# Patient Record
Sex: Female | Born: 1950 | Race: Black or African American | Hispanic: No | Smoking: Never smoker
Health system: Southern US, Community
[De-identification: ages and names within clinical notes are randomized; demographics above are authoritative.]

## PROBLEM LIST (undated history)

## (undated) DIAGNOSIS — I10 Essential (primary) hypertension: Secondary | ICD-10-CM

## (undated) DIAGNOSIS — K219 Gastro-esophageal reflux disease without esophagitis: Secondary | ICD-10-CM

## (undated) DIAGNOSIS — E782 Mixed hyperlipidemia: Secondary | ICD-10-CM

## (undated) HISTORY — DX: Gastro-esophageal reflux disease without esophagitis: K21.9

## (undated) HISTORY — PX: CHOLECYSTECTOMY: SHX55

## (undated) HISTORY — PX: APPENDECTOMY: SHX54

## (undated) HISTORY — PX: ABDOMINAL SURGERY: SHX537

## (undated) HISTORY — PX: SHOULDER SURGERY: SHX246

## (undated) HISTORY — PX: TUBAL LIGATION: SHX77

## (undated) HISTORY — DX: Essential (primary) hypertension: I10

---

## 2010-12-28 HISTORY — PX: COLONOSCOPY: SHX174

## 2014-01-23 ENCOUNTER — Other Ambulatory Visit: Payer: Self-pay | Admitting: *Deleted

## 2014-01-23 DIAGNOSIS — I83221 Varicose veins of left lower extremity with both ulcer of thigh and inflammation: Secondary | ICD-10-CM

## 2014-01-23 DIAGNOSIS — I83223 Varicose veins of left lower extremity with both ulcer of ankle and inflammation: Principal | ICD-10-CM

## 2014-01-23 DIAGNOSIS — I83224 Varicose veins of left lower extremity with both ulcer of heel and midfoot and inflammation: Principal | ICD-10-CM

## 2014-01-23 DIAGNOSIS — I83229 Varicose veins of left lower extremity with both ulcer of unspecified site and inflammation: Principal | ICD-10-CM

## 2014-01-23 DIAGNOSIS — I83225 Varicose veins of left lower extremity with both ulcer other part of foot and inflammation: Principal | ICD-10-CM

## 2014-01-23 DIAGNOSIS — I83222 Varicose veins of left lower extremity with both ulcer of calf and inflammation: Principal | ICD-10-CM

## 2014-01-23 DIAGNOSIS — I83228 Varicose veins of left lower extremity with both ulcer of other part of lower extremity and inflammation: Principal | ICD-10-CM

## 2014-02-10 ENCOUNTER — Encounter: Payer: 59 | Admitting: Vascular Surgery

## 2014-02-10 ENCOUNTER — Encounter (HOSPITAL_COMMUNITY): Payer: 59

## 2014-03-16 ENCOUNTER — Encounter: Payer: Self-pay | Admitting: Vascular Surgery

## 2014-03-17 ENCOUNTER — Ambulatory Visit (HOSPITAL_COMMUNITY)
Admission: RE | Admit: 2014-03-17 | Discharge: 2014-03-17 | Disposition: A | Discharge status: Home / Self Care | Payer: 59 | Source: Ambulatory Visit | Attending: Vascular Surgery | Admitting: Vascular Surgery

## 2014-03-17 ENCOUNTER — Ambulatory Visit (INDEPENDENT_AMBULATORY_CARE_PROVIDER_SITE_OTHER): Payer: 59 | Admitting: Vascular Surgery

## 2014-03-17 ENCOUNTER — Encounter: Payer: Self-pay | Admitting: Vascular Surgery

## 2014-03-17 VITALS — BP 152/92 | HR 69 | Resp 16 | Ht 67.0 in | Wt 210.0 lb

## 2014-03-17 DIAGNOSIS — I83222 Varicose veins of left lower extremity with both ulcer of calf and inflammation: Secondary | ICD-10-CM

## 2014-03-17 DIAGNOSIS — R600 Localized edema: Secondary | ICD-10-CM | POA: Insufficient documentation

## 2014-03-17 DIAGNOSIS — I83225 Varicose veins of left lower extremity with both ulcer other part of foot and inflammation: Secondary | ICD-10-CM

## 2014-03-17 DIAGNOSIS — I83899 Varicose veins of unspecified lower extremities with other complications: Secondary | ICD-10-CM | POA: Insufficient documentation

## 2014-03-17 DIAGNOSIS — I83228 Varicose veins of left lower extremity with both ulcer of other part of lower extremity and inflammation: Secondary | ICD-10-CM

## 2014-03-17 DIAGNOSIS — I83892 Varicose veins of left lower extremities with other complications: Secondary | ICD-10-CM

## 2014-03-17 DIAGNOSIS — I83223 Varicose veins of left lower extremity with both ulcer of ankle and inflammation: Secondary | ICD-10-CM

## 2014-03-17 DIAGNOSIS — I83221 Varicose veins of left lower extremity with both ulcer of thigh and inflammation: Secondary | ICD-10-CM

## 2014-03-17 DIAGNOSIS — I83224 Varicose veins of left lower extremity with both ulcer of heel and midfoot and inflammation: Secondary | ICD-10-CM

## 2014-03-17 DIAGNOSIS — I83229 Varicose veins of left lower extremity with both ulcer of unspecified site and inflammation: Secondary | ICD-10-CM

## 2014-03-17 NOTE — Progress Notes (Signed)
Subjective:     Patient ID: Yesenia Bishop, female   DOB: 11-05-1950, 63 y.o.   MRN: [id number]  HPI this 63 year old female was referred by Dr. Nils Pyle in the [facility] [address] wound center for evaluation of venous insufficiency. Patient has had multiple ulcerations left leg over the last few years which ever acquired treatment to heal. She has chronic swelling the left leg. She has no history of bleeding or ulceration in the right leg. She does not wear long-leg elastic compression stockings but does occasionally wear short-leg stockings. She denies a history of DVT in the left leg but did have a DVT in the right leg after her surgical procedure 10 years ago requiring Coumadin on a short-term basis. Sheet develops aching and throbbing discomfort in the left leg as the day progresses.  History reviewed. No pertinent past medical history.  History  Substance Use Topics  . Smoking status: Never Smoker   . Smokeless tobacco: Not on file  . Alcohol Use: 0.6 oz/week    1 Glasses of wine per week    Family History  Problem Relation Age of Onset  . Diabetes Father   . Heart disease Father   . Hyperlipidemia Father   . Hypertension Father   . Varicose Veins Father   . Hypertension Brother     No Known Allergies  Current outpatient prescriptions:amLODipine-olmesartan (AZOR) 10-40 MG per tablet, Take 1 tablet by mouth daily., Disp: , Rfl: ;  aspirin 81 MG tablet, Take 81 mg by mouth daily., Disp: , Rfl: ;  metoprolol succinate (TOPROL-XL) 100 MG 24 hr tablet, Take 100 mg by mouth 2 (two) times daily. Take with or immediately following a meal. Take half a pill BID, Disp: , Rfl: ;  pantoprazole (PROTONIX) 40 MG tablet, Take 40 mg by mouth daily., Disp: , Rfl:   BP 152/92  Pulse 69  Resp 16  Ht 5\' 7"  (1.702 m)  Wt 210 lb (95.255 kg)  BMI 32.88 kg/m2  Body mass index is 32.88 kg/(m^2).          Review of Systems denies chest pain, dyspnea on exertion, PND, orthopnea, hemoptysis, lateralizing  weakness, aphasia, amaurosis fugax, diplopia, syncope. All other systems negative and a complete review of systems     Objective:   Physical Exam BP 152/92  Pulse 69  Resp 16  Ht 5\' 7"  (1.702 m)  Wt 210 lb (95.255 kg)  BMI 32.88 kg/m2  Gen.-alert and oriented x3 in no apparent distress HEENT normal for age Lungs no rhonchi or wheezing Cardiovascular regular rhythm no murmurs carotid pulses 3+ palpable no bruits audible Abdomen soft nontender no palpable masses Musculoskeletal free of  major deformities Skin -left leg with 2 areas of severe hyperpigmentation in the lower half of the pretibial region and just proximal to the medial malleolus. No active ulcer but evidence of 2 healed ulcers. 1+ edema left leg.  Neurologic normal Lower extremities 3+ femoral and dorsalis pedis pulses palpable bilateral with 1+ edema left leg no edema right leg.  Today I ordered a venous duplex exam of the left leg which are reviewed and interpreted. There is no DVT. There is some mild reflux in the deep system. There is gross reflux throughout the entire left great saphenous vein from the knee to near the saphenofemoral junction.        Assessment:     History venous stasis ulcers-multiple left leg due to gross reflux left great saphenous vein    Plan:         #  1 long leg elastic compression stockings 20-30 mm gradient #2 elevate legs as much as possible #3 ibuprofen daily on a regular basis for pain #4 return in 3 months-if no significant improvement then patient will need laser blush in the left great saphenous vein

## 2014-06-22 ENCOUNTER — Encounter: Payer: Self-pay | Admitting: Vascular Surgery

## 2014-06-23 ENCOUNTER — Ambulatory Visit: Payer: 59 | Admitting: Vascular Surgery

## 2016-08-08 DIAGNOSIS — M79674 Pain in right toe(s): Secondary | ICD-10-CM | POA: Diagnosis not present

## 2016-08-08 DIAGNOSIS — M79675 Pain in left toe(s): Secondary | ICD-10-CM | POA: Diagnosis not present

## 2016-08-08 DIAGNOSIS — L600 Ingrowing nail: Secondary | ICD-10-CM | POA: Diagnosis not present

## 2016-08-08 DIAGNOSIS — B351 Tinea unguium: Secondary | ICD-10-CM | POA: Diagnosis not present

## 2016-08-08 DIAGNOSIS — L602 Onychogryphosis: Secondary | ICD-10-CM | POA: Diagnosis not present

## 2016-10-24 DIAGNOSIS — M79674 Pain in right toe(s): Secondary | ICD-10-CM | POA: Diagnosis not present

## 2016-10-24 DIAGNOSIS — L600 Ingrowing nail: Secondary | ICD-10-CM | POA: Diagnosis not present

## 2016-10-24 DIAGNOSIS — M722 Plantar fascial fibromatosis: Secondary | ICD-10-CM | POA: Diagnosis not present

## 2016-10-24 DIAGNOSIS — M79675 Pain in left toe(s): Secondary | ICD-10-CM | POA: Diagnosis not present

## 2016-10-24 DIAGNOSIS — B351 Tinea unguium: Secondary | ICD-10-CM | POA: Diagnosis not present

## 2016-10-24 DIAGNOSIS — L602 Onychogryphosis: Secondary | ICD-10-CM | POA: Diagnosis not present

## 2016-12-14 DIAGNOSIS — H2513 Age-related nuclear cataract, bilateral: Secondary | ICD-10-CM | POA: Diagnosis not present

## 2016-12-14 DIAGNOSIS — H25013 Cortical age-related cataract, bilateral: Secondary | ICD-10-CM | POA: Diagnosis not present

## 2016-12-14 DIAGNOSIS — H40013 Open angle with borderline findings, low risk, bilateral: Secondary | ICD-10-CM | POA: Diagnosis not present

## 2016-12-14 DIAGNOSIS — H04123 Dry eye syndrome of bilateral lacrimal glands: Secondary | ICD-10-CM | POA: Diagnosis not present

## 2017-03-01 DIAGNOSIS — Z1231 Encounter for screening mammogram for malignant neoplasm of breast: Secondary | ICD-10-CM | POA: Diagnosis not present

## 2017-03-07 DIAGNOSIS — Z23 Encounter for immunization: Secondary | ICD-10-CM | POA: Diagnosis not present

## 2017-03-19 DIAGNOSIS — Z1339 Encounter for screening examination for other mental health and behavioral disorders: Secondary | ICD-10-CM | POA: Diagnosis not present

## 2017-03-19 DIAGNOSIS — Z6832 Body mass index (BMI) 32.0-32.9, adult: Secondary | ICD-10-CM | POA: Diagnosis not present

## 2017-03-19 DIAGNOSIS — R001 Bradycardia, unspecified: Secondary | ICD-10-CM | POA: Diagnosis not present

## 2017-03-19 DIAGNOSIS — Z1331 Encounter for screening for depression: Secondary | ICD-10-CM | POA: Diagnosis not present

## 2017-03-19 DIAGNOSIS — Z7189 Other specified counseling: Secondary | ICD-10-CM | POA: Diagnosis not present

## 2017-03-19 DIAGNOSIS — Z79899 Other long term (current) drug therapy: Secondary | ICD-10-CM | POA: Diagnosis not present

## 2017-03-19 DIAGNOSIS — Z1211 Encounter for screening for malignant neoplasm of colon: Secondary | ICD-10-CM | POA: Diagnosis not present

## 2017-03-19 DIAGNOSIS — R5383 Other fatigue: Secondary | ICD-10-CM | POA: Diagnosis not present

## 2017-03-19 DIAGNOSIS — Z0000 Encounter for general adult medical examination without abnormal findings: Secondary | ICD-10-CM | POA: Diagnosis not present

## 2017-03-19 DIAGNOSIS — D649 Anemia, unspecified: Secondary | ICD-10-CM | POA: Diagnosis not present

## 2017-03-19 DIAGNOSIS — E8940 Asymptomatic postprocedural ovarian failure: Secondary | ICD-10-CM | POA: Diagnosis not present

## 2017-03-19 DIAGNOSIS — Z299 Encounter for prophylactic measures, unspecified: Secondary | ICD-10-CM | POA: Diagnosis not present

## 2017-03-19 DIAGNOSIS — I10 Essential (primary) hypertension: Secondary | ICD-10-CM | POA: Diagnosis not present

## 2017-03-26 DIAGNOSIS — R9431 Abnormal electrocardiogram [ECG] [EKG]: Secondary | ICD-10-CM | POA: Diagnosis not present

## 2017-04-10 DIAGNOSIS — E2839 Other primary ovarian failure: Secondary | ICD-10-CM | POA: Diagnosis not present

## 2017-05-14 DIAGNOSIS — H40013 Open angle with borderline findings, low risk, bilateral: Secondary | ICD-10-CM | POA: Diagnosis not present

## 2018-05-29 DIAGNOSIS — I219 Acute myocardial infarction, unspecified: Secondary | ICD-10-CM

## 2018-05-29 HISTORY — DX: Acute myocardial infarction, unspecified: I21.9

## 2018-08-27 ENCOUNTER — Inpatient Hospital Stay (HOSPITAL_COMMUNITY)
Admission: AD | Admit: 2018-08-27 | Discharge: 2018-08-28 | DRG: 282 | Disposition: A | Discharge status: Home / Self Care | Payer: Medicare HMO | Source: Other Acute Inpatient Hospital | Attending: Cardiology | Admitting: Cardiology

## 2018-08-27 DIAGNOSIS — I872 Venous insufficiency (chronic) (peripheral): Secondary | ICD-10-CM | POA: Diagnosis present

## 2018-08-27 DIAGNOSIS — I214 Non-ST elevation (NSTEMI) myocardial infarction: Secondary | ICD-10-CM | POA: Diagnosis present

## 2018-08-27 DIAGNOSIS — E782 Mixed hyperlipidemia: Secondary | ICD-10-CM | POA: Diagnosis present

## 2018-08-27 DIAGNOSIS — Z833 Family history of diabetes mellitus: Secondary | ICD-10-CM | POA: Diagnosis not present

## 2018-08-27 DIAGNOSIS — I10 Essential (primary) hypertension: Secondary | ICD-10-CM | POA: Diagnosis present

## 2018-08-27 DIAGNOSIS — Z8349 Family history of other endocrine, nutritional and metabolic diseases: Secondary | ICD-10-CM | POA: Diagnosis not present

## 2018-08-27 DIAGNOSIS — Z9049 Acquired absence of other specified parts of digestive tract: Secondary | ICD-10-CM

## 2018-08-27 DIAGNOSIS — I8390 Asymptomatic varicose veins of unspecified lower extremity: Secondary | ICD-10-CM | POA: Diagnosis present

## 2018-08-27 DIAGNOSIS — Z8249 Family history of ischemic heart disease and other diseases of the circulatory system: Secondary | ICD-10-CM | POA: Diagnosis not present

## 2018-08-27 DIAGNOSIS — R600 Localized edema: Secondary | ICD-10-CM | POA: Diagnosis present

## 2018-08-27 DIAGNOSIS — Z79899 Other long term (current) drug therapy: Secondary | ICD-10-CM

## 2018-08-27 HISTORY — DX: Mixed hyperlipidemia: E78.2

## 2018-08-27 LAB — LIPID PANEL
CHOLESTEROL: 216 mg/dL — AB (ref 0–200)
HDL: 85 mg/dL (ref >40)
LDL Cholesterol: 116 mg/dL — ABNORMAL HIGH (ref 0–99)
Total CHOL/HDL Ratio: 2.5 RATIO
Triglycerides: 74 mg/dL (ref <150)
VLDL: 15 mg/dL (ref 0–40)

## 2018-08-27 LAB — HEPARIN LEVEL (UNFRACTIONATED): Heparin Unfractionated: 0.81 IU/mL — ABNORMAL HIGH (ref 0.30–0.70)

## 2018-08-27 LAB — TROPONIN I: Troponin I: 0.18 ng/mL (ref <0.03)

## 2018-08-27 MED ORDER — SODIUM CHLORIDE 0.9 % IV SOLN: 250.0000 mL | INTRAVENOUS | Status: DC | PRN

## 2018-08-27 MED ORDER — NITROGLYCERIN 0.4 MG SL SUBL: 0.4000 mg | SUBLINGUAL_TABLET | SUBLINGUAL | Status: DC | PRN

## 2018-08-27 MED ORDER — ASPIRIN 81 MG PO CHEW
81.0000 mg | CHEWABLE_TABLET | ORAL | Status: AC
  Administered 2018-08-28: 81 mg via ORAL
  Filled 2018-08-27: qty 1

## 2018-08-27 MED ORDER — SODIUM CHLORIDE 0.9 % WEIGHT BASED INFUSION
3.0000 mL/kg/h | INTRAVENOUS | Status: AC
  Administered 2018-08-28: 04:00:00 3 mL/kg/h via INTRAVENOUS

## 2018-08-27 MED ORDER — SODIUM CHLORIDE 0.9% FLUSH: 3.0000 mL | INTRAVENOUS | Status: DC | PRN

## 2018-08-27 MED ORDER — NITROGLYCERIN IN D5W 200-5 MCG/ML-% IV SOLN: 10.0000 ug/min | INTRAVENOUS | Status: DC

## 2018-08-27 MED ORDER — ASPIRIN EC 81 MG PO TBEC: 81.0000 mg | DELAYED_RELEASE_TABLET | Freq: Every day | ORAL | Status: DC

## 2018-08-27 MED ORDER — ATORVASTATIN CALCIUM 80 MG PO TABS: 80.0000 mg | ORAL_TABLET | Freq: Every day | ORAL | Status: DC

## 2018-08-27 MED ORDER — ACETAMINOPHEN 325 MG PO TABS: 650.0000 mg | ORAL_TABLET | ORAL | Status: DC | PRN

## 2018-08-27 MED ORDER — HEPARIN (PORCINE) 25000 UT/250ML-% IV SOLN
900.0000 [IU]/h | INTRAVENOUS | Status: DC
  Administered 2018-08-27: 900 [IU]/h via INTRAVENOUS

## 2018-08-27 MED ORDER — ONDANSETRON HCL 4 MG/2ML IJ SOLN: 4.0000 mg | Freq: Four times a day (QID) | INTRAMUSCULAR | Status: DC | PRN

## 2018-08-27 MED ORDER — CARVEDILOL 6.25 MG PO TABS: 6.2500 mg | ORAL_TABLET | Freq: Two times a day (BID) | ORAL | Status: DC

## 2018-08-27 MED ORDER — SODIUM CHLORIDE 0.9% FLUSH: 3.0000 mL | Freq: Two times a day (BID) | INTRAVENOUS | Status: DC

## 2018-08-27 MED ORDER — SODIUM CHLORIDE 0.9 % WEIGHT BASED INFUSION
1.0000 mL/kg/h | INTRAVENOUS | Status: DC
  Administered 2018-08-28: 2.787 mL/kg/h via INTRAVENOUS

## 2018-08-27 NOTE — Progress Notes (Signed)
CRITICAL VALUE ALERT  Critical Value:  Troponin 0.18  Date & Time Notied:  08/27/2018 @approx . 2030  Provider Notified: Dr. Einar Gip already aware of pt's elevated Troponins from OSH. Pt already on Heparin gtt, scheduled for heart cath in AM, and presently denies pain. VSS  Orders Received/Actions taken: N/A

## 2018-08-27 NOTE — Progress Notes (Signed)
ANTICOAGULATION CONSULT NOTE - Initial Consult  Pharmacy Consult for heparin Indication: chest pain/ACS  No Known Allergies  Patient Measurements: Height: 5\' 6"  (167.6 cm) Weight: 197 lb 12 oz (89.7 kg) IBW/kg (Calculated) : 59.3 Heparin Dosing Weight: 78.8 kg  Vital Signs: Temp: 97.6 F (36.4 C) (03/31 1941) Temp Source: Oral (03/31 1941) BP: 151/96 (03/31 1941) Pulse Rate: 61 (03/31 1941)  Labs: No results for input(s): HGB, HCT, PLT, APTT, LABPROT, INR, HEPARINUNFRC, HEPRLOWMOCWT, CREATININE, CKTOTAL, CKMB, TROPONINI in the last 72 hours.  CrCl cannot be calculated (No successful lab value found.).   Medical History: No past medical history on file.  Medications:  Scheduled:  . [START ON 08/28/2018] aspirin  81 mg Oral Pre-Cath  . [START ON 08/28/2018] aspirin EC  81 mg Oral Daily  . [START ON 08/28/2018] atorvastatin  80 mg Oral q1800  . [START ON 08/28/2018] carvedilol  6.25 mg Oral BID WC  . sodium chloride flush  3 mL Intravenous Q12H    Assessment: 61 yof transferred from [facility], started on heparin infusion for ACS. No records of AC PTA. Received 5000 unit bolus on 3/31@1655  followed by 1000 units/hr infusion.  No s/sx of bleeding. Initial heparin level tonight (~3.5 hours after bolus and infusion initiation) came back at 0.81, on 1000 units/hr. No infusion issues since arriving.    Goal of Therapy:  Heparin level 0.3-0.7 units/ml Monitor platelets by anticoagulation protocol: Yes   Plan:  Reduce heparin infusion slightly to 900 units/hr Monitor daily HL, CBC, and for s/sx of bleeding  Antonietta Jewel, PharmD, [place] Clinical Pharmacist  Pager: [phone] Phone: [phone] 08/27/2018,8:25 PM

## 2018-08-28 ENCOUNTER — Other Ambulatory Visit: Payer: Self-pay

## 2018-08-28 ENCOUNTER — Ambulatory Visit (HOSPITAL_COMMUNITY): Admit: 2018-08-28 | Payer: Medicare HMO | Admitting: Cardiology

## 2018-08-28 ENCOUNTER — Inpatient Hospital Stay (HOSPITAL_COMMUNITY): Payer: Medicare HMO

## 2018-08-28 ENCOUNTER — Encounter (HOSPITAL_COMMUNITY)
Admission: AD | Disposition: A | Discharge status: Home / Self Care | Payer: Self-pay | Source: Other Acute Inpatient Hospital | Attending: Cardiology

## 2018-08-28 ENCOUNTER — Encounter (HOSPITAL_COMMUNITY): Payer: Self-pay | Admitting: Cardiology

## 2018-08-28 DIAGNOSIS — I10 Essential (primary) hypertension: Secondary | ICD-10-CM

## 2018-08-28 DIAGNOSIS — E782 Mixed hyperlipidemia: Secondary | ICD-10-CM | POA: Insufficient documentation

## 2018-08-28 DIAGNOSIS — I214 Non-ST elevation (NSTEMI) myocardial infarction: Principal | ICD-10-CM

## 2018-08-28 HISTORY — DX: Mixed hyperlipidemia: E78.2

## 2018-08-28 HISTORY — PX: LEFT HEART CATH AND CORONARY ANGIOGRAPHY: CATH118249

## 2018-08-28 LAB — CBC
HCT: 33.8 % — ABNORMAL LOW (ref 36.0–46.0)
HCT: 31.4 % — ABNORMAL LOW (ref 36.0–46.0)
HEMOGLOBIN: 10.7 g/dL — AB (ref 12.0–15.0)
Hemoglobin: 10.3 g/dL — ABNORMAL LOW (ref 12.0–15.0)
MCH: 25.4 pg — ABNORMAL LOW (ref 26.0–34.0)
MCH: 26.3 pg (ref 26.0–34.0)
MCHC: 31.7 g/dL (ref 30.0–36.0)
MCHC: 32.8 g/dL (ref 30.0–36.0)
MCV: 80.1 fL (ref 80.0–100.0)
MCV: 80.3 fL (ref 80.0–100.0)
Platelets: 190 10*3/uL (ref 150–400)
Platelets: 192 10*3/uL (ref 150–400)
RBC: 4.22 MIL/uL (ref 3.87–5.11)
RBC: 3.91 MIL/uL (ref 3.87–5.11)
RDW: 17 % — ABNORMAL HIGH (ref 11.5–15.5)
RDW: 17.2 % — ABNORMAL HIGH (ref 11.5–15.5)
WBC: 6.9 10*3/uL (ref 4.0–10.5)
WBC: 6.9 10*3/uL (ref 4.0–10.5)
nRBC: 0 % (ref 0.0–0.2)
nRBC: 0 % (ref 0.0–0.2)

## 2018-08-28 LAB — BASIC METABOLIC PANEL
Anion gap: 15 (ref 5–15)
BUN: 17 mg/dL (ref 8–23)
CO2: 24 mmol/L (ref 22–32)
Calcium: 9.5 mg/dL (ref 8.9–10.3)
Chloride: 98 mmol/L (ref 98–111)
Creatinine, Ser: 0.99 mg/dL (ref 0.44–1.00)
GFR calc Af Amer: >60 mL/min (ref >60)
GFR calc non Af Amer: 59 mL/min — ABNORMAL LOW (ref >60)
Glucose, Bld: 94 mg/dL (ref 70–99)
Potassium: 3.3 mmol/L — ABNORMAL LOW (ref 3.5–5.1)
Sodium: 137 mmol/L (ref 135–145)

## 2018-08-28 LAB — HEPARIN LEVEL (UNFRACTIONATED): Heparin Unfractionated: 0.4 IU/mL (ref 0.30–0.70)

## 2018-08-28 LAB — ECHOCARDIOGRAM LIMITED
Height: 66 in
Weight: 3164.04 oz

## 2018-08-28 LAB — TROPONIN I
Troponin I: 0.17 ng/mL (ref <0.03)
Troponin I: 0.14 ng/mL (ref <0.03)

## 2018-08-28 LAB — HIV ANTIBODY (ROUTINE TESTING W REFLEX): HIV Screen 4th Generation wRfx: NONREACTIVE

## 2018-08-28 SURGERY — LEFT HEART CATH AND CORONARY ANGIOGRAPHY
Anesthesia: LOCAL

## 2018-08-28 MED ORDER — HEPARIN (PORCINE) IN NACL 1000-0.9 UT/500ML-% IV SOLN
INTRAVENOUS | Status: DC | PRN
  Administered 2018-08-28 (×3): 500 mL

## 2018-08-28 MED ORDER — IOHEXOL 350 MG/ML SOLN
INTRAVENOUS | Status: DC | PRN
  Administered 2018-08-28: 10:00:00 85 mL via INTRAVENOUS

## 2018-08-28 MED ORDER — LIDOCAINE HCL (PF) 1 % IJ SOLN
INTRAMUSCULAR | Status: DC | PRN
  Administered 2018-08-28: 2 mL

## 2018-08-28 MED ORDER — NITROGLYCERIN 1 MG/10 ML FOR IR/CATH LAB
INTRA_ARTERIAL | Status: DC | PRN
  Administered 2018-08-28: 200 ug
  Administered 2018-08-28: 100 ug

## 2018-08-28 MED ORDER — MIDAZOLAM HCL 2 MG/2ML IJ SOLN
INTRAMUSCULAR | Status: DC | PRN
  Administered 2018-08-28: 2 mg via INTRAVENOUS

## 2018-08-28 MED ORDER — NITROGLYCERIN 0.4 MG SL SUBL: 0.4000 mg | SUBLINGUAL_TABLET | SUBLINGUAL | 1 refills | Status: DC | PRN

## 2018-08-28 MED ORDER — VERAPAMIL HCL 2.5 MG/ML IV SOLN
INTRAVENOUS | Status: AC
  Filled 2018-08-28: qty 2

## 2018-08-28 MED ORDER — LIDOCAINE HCL (PF) 1 % IJ SOLN
INTRAMUSCULAR | Status: AC
  Filled 2018-08-28: qty 30

## 2018-08-28 MED ORDER — HEPARIN SODIUM (PORCINE) 1000 UNIT/ML IJ SOLN
INTRAMUSCULAR | Status: AC
  Filled 2018-08-28: qty 1

## 2018-08-28 MED ORDER — ATORVASTATIN CALCIUM 80 MG PO TABS: 80.0000 mg | ORAL_TABLET | Freq: Every day | ORAL | 1 refills | Status: DC

## 2018-08-28 MED ORDER — HYDRALAZINE HCL 20 MG/ML IJ SOLN: 10.0000 mg | INTRAMUSCULAR | Status: DC | PRN

## 2018-08-28 MED ORDER — MIDAZOLAM HCL 2 MG/2ML IJ SOLN
INTRAMUSCULAR | Status: AC
  Filled 2018-08-28: qty 2

## 2018-08-28 MED ORDER — NITROGLYCERIN 1 MG/10 ML FOR IR/CATH LAB
INTRA_ARTERIAL | Status: AC
  Filled 2018-08-28: qty 10

## 2018-08-28 MED ORDER — SODIUM CHLORIDE 0.9% FLUSH: 3.0000 mL | INTRAVENOUS | Status: DC | PRN

## 2018-08-28 MED ORDER — SODIUM CHLORIDE 0.9 % WEIGHT BASED INFUSION: 1.0000 mL/kg/h | INTRAVENOUS | Status: AC

## 2018-08-28 MED ORDER — HEPARIN (PORCINE) IN NACL 1000-0.9 UT/500ML-% IV SOLN
INTRAVENOUS | Status: AC
  Filled 2018-08-28: qty 1500

## 2018-08-28 MED ORDER — FENTANYL CITRATE (PF) 100 MCG/2ML IJ SOLN
INTRAMUSCULAR | Status: DC | PRN
  Administered 2018-08-28: 25 ug via INTRAVENOUS

## 2018-08-28 MED ORDER — POTASSIUM CHLORIDE CRYS ER 10 MEQ PO TBCR: 20.0000 meq | EXTENDED_RELEASE_TABLET | Freq: Once | ORAL | Status: DC

## 2018-08-28 MED ORDER — SODIUM CHLORIDE 0.9% FLUSH: 3.0000 mL | Freq: Two times a day (BID) | INTRAVENOUS | Status: DC

## 2018-08-28 MED ORDER — FENTANYL CITRATE (PF) 100 MCG/2ML IJ SOLN
INTRAMUSCULAR | Status: AC
  Filled 2018-08-28: qty 2

## 2018-08-28 MED ORDER — SODIUM CHLORIDE 0.9 % IV SOLN: 250.0000 mL | INTRAVENOUS | Status: DC | PRN

## 2018-08-28 MED ORDER — VERAPAMIL HCL 2.5 MG/ML IV SOLN
INTRAVENOUS | Status: DC | PRN
  Administered 2018-08-28: 10 mL via INTRA_ARTERIAL

## 2018-08-28 MED ORDER — HEPARIN SODIUM (PORCINE) 1000 UNIT/ML IJ SOLN
INTRAMUSCULAR | Status: DC | PRN
  Administered 2018-08-28: 8000 [IU] via INTRAVENOUS

## 2018-08-28 SURGICAL SUPPLY — 12 items
CATH INFINITI 5FR ANG PIGTAIL (CATHETERS) ×2 IMPLANT
CATH OPTITORQUE TIG 4.0 5F (CATHETERS) ×2 IMPLANT
CATHETER LAUNCHER 6FR AL2 SH (CATHETERS) ×2 IMPLANT
DEVICE RAD COMP TR BAND LRG (VASCULAR PRODUCTS) ×2 IMPLANT
GLIDESHEATH SLEND A-KIT 6F 20G (SHEATH) ×2 IMPLANT
GUIDEWIRE ANGLED .035X150CM (WIRE) ×2 IMPLANT
GUIDEWIRE INQWIRE 1.5J.035X260 (WIRE) ×1 IMPLANT
INQWIRE 1.5J .035X260CM (WIRE) ×2
KIT HEART LEFT (KITS) ×2 IMPLANT
PACK CARDIAC CATHETERIZATION (CUSTOM PROCEDURE TRAY) ×2 IMPLANT
TRANSDUCER W/STOPCOCK (MISCELLANEOUS) ×2 IMPLANT
TUBING CIL FLEX 10 FLL-RA (TUBING) ×2 IMPLANT

## 2018-08-28 NOTE — Progress Notes (Signed)
D/C instructions given to pt. Medication and RR cath site instructions reviewed. All questions answered. Son to escort pt home.  Clyde Canterbury, RN

## 2018-08-28 NOTE — Discharge Summary (Addendum)
Physician Discharge Summary  Patient ID: Yesenia Bishop MRN: [id number] DOB/AGE: [birth year] 68 y.o.  Admit date: 08/27/2018 Discharge date: 08/28/2018  Primary Discharge Diagnosis 1.  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  Essential hypertension 3.  Mixed hyperlipidemia 4.  Chronic venous insufficiency  Significant Diagnostic Studies:  EKG 08/27/2018: Normal sinus rhythm, normal axis, T wave inversion noted in anterolateral lead and nonspecific T abnormality in the inferior leads, cannot exclude ischemia.  Prolonged QT.  Abnormal EKG  Coronary angiogram 08/28/2018: Generalized diffuse arteriomegaly with large vessels with minimal luminal irregularity.  Slow filling is evident in the coronary arteries which improved with nitroglycerin administration.  Marked ST-T wave changes were evident during contrast injection for the coronary arteries.  This suggests endothelial dysfunction. Normal LV systolic function, normal LVEDP.  Hospital Course: Admitted to the hospital for chest pain and abnormal EKG and abnormal cardiac markers suggestive of non-STEMI.  She was started on IV heparin and nitroglycerin, had no recurrence of chest pain.  Underwent coronary angiography the following morning, was found to have diffuse arteriomegaly with mild luminal obstruction, slow filling was evident, improved with intracoronary nitroglycerin.  She was felt stable for discharge.   Recommendations on discharge:  Patient is already on a beta-blocker and calcium channel blocker, continue the same.  We will add statin to improve endothelial function.  Start with high-dose, can be adjusted once followed up with her PCP.  She will be discharged home today.  NTG prescription also given to the patient upon discharge.   Discharge Exam: Blood pressure 123/77, pulse (P) 60, temperature (!) (P) 97.5 F (36.4 C), temperature source (P) Oral, resp. rate (P) 18, height 5\' 6"  (1.676 m), weight  89.7 kg, SpO2 (P) 100 %.  General appearance: alert, cooperative, appears stated age and no distress Resp: clear to auscultation bilaterally Cardio: regular rate and rhythm, S1, S2 normal, no murmur, click, rub or gallop GI: soft, non-tender; bowel sounds normal; no masses,  no organomegaly Extremities: extremities normal, atraumatic, no cyanosis or edema Neurologic: Grossly normal Labs:   Lab Results  Component Value Date   WBC 6.9 08/28/2018   HGB 10.3 (L) 08/28/2018   HCT 31.4 (L) 08/28/2018   MCV 80.3 08/28/2018   PLT 192 08/28/2018    Recent Labs  Lab 08/28/18 0247  NA 137  K 3.3*  CL 98  CO2 24  BUN 17  CREATININE 0.99  CALCIUM 9.5  GLUCOSE 94    Lipid Panel     Component Value Date/Time   CHOL 216 (H) 08/27/2018 2024   TRIG 74 08/27/2018 2024   HDL 85 08/27/2018 2024   CHOLHDL 2.5 08/27/2018 2024   VLDL 15 08/27/2018 2024   LDLCALC 116 (H) 08/27/2018 2024   Cardiac Panel (last 3 results) Recent Labs    08/27/18 2024 08/28/18 0247 08/28/18 0718  TROPONINI 0.18* 0.17* 0.14*    Lab Results  Component Value Date   TROPONINI 0.14 ([place]) 08/28/2018    FOLLOW UP PLANS AND APPOINTMENTS  Allergies as of 08/28/2018   No Known Allergies     Medication List    TAKE these medications   aspirin 81 MG tablet Take 81 mg by mouth daily.   atorvastatin 80 MG tablet Commonly known as:  LIPITOR Take 1 tablet (80 mg total) by mouth daily at 6 PM.   Azor 10-40 MG tablet Generic drug:  amLODipine-olmesartan Take 1 tablet by mouth daily.   metoprolol succinate 100 MG 24 hr tablet  Commonly known as:  TOPROL-XL Take 100 mg by mouth 2 (two) times daily. Take with or immediately following a meal. Take half a pill BID   nitroGLYCERIN 0.4 MG SL tablet Commonly known as:  NITROSTAT Place 1 tablet (0.4 mg total) under the tongue every 5 (five) minutes x 3 doses as needed for chest pain.   pantoprazole 40 MG tablet Commonly known as:  PROTONIX Take 40 mg by mouth  daily.      Follow-up Information    Monico Blitz, MD. Schedule an appointment as soon as possible for a visit in 2 weeks.   Specialty:  Internal Medicine Why:  For evaluation of medications and chest pain Contact information: [place] [address] [phone]          Adrian Prows, MD 08/28/2018, 9:52 AM  Pager: [phone] Office: [phone] If no answer: [phone]

## 2018-08-28 NOTE — Progress Notes (Signed)
  Echocardiogram 2D Echocardiogram has been performed.  Jennette Dubin 08/28/2018, 2:01 PM

## 2018-08-28 NOTE — Interval H&P Note (Signed)
History and Physical Interval Note:  08/28/2018 8:34 AM  Yesenia Bishop  has presented today for surgery, with the diagnosis of n stemi.  The various methods of treatment have been discussed with the patient and family. After consideration of risks, benefits and other options for treatment, the patient has consented to  Procedure(s): LEFT HEART CATH AND CORONARY ANGIOGRAPHY (N/A) and possible angioplasty as a surgical intervention.  The patient's history has been reviewed, patient examined, no change in status, stable for surgery.  I have reviewed the patient's chart and labs.  Questions were answered to the patient's satisfaction.   Cath Lab Visit (complete for each Cath Lab visit)  Clinical Evaluation Leading to the Procedure:   ACS: Yes.    Non-ACS:    Anginal Classification: CCS IV  Anti-ischemic medical therapy: Maximal Therapy (2 or more classes of medications)  Non-Invasive Test Results: No non-invasive testing performed  Prior CABG: No previous CABG        Adrian Prows

## 2018-08-28 NOTE — Progress Notes (Signed)
Pt received from cath lab. Pt has TR band in place with 14cc of air. Pt vitals stable. Pt denies any complaints. CCMD notified to resume monitoring. Call bell within reach. Will continue to monitor.  Jerald Kief, RN

## 2018-08-28 NOTE — H&P (Signed)
Yesenia Bishop is an 68 y.o. female.   Chief Complaint: Chest pain HPI: Yesenia Bishop  is a 67 y.o. female  With hypertension, hyperlipidemia, varicose veins of the lower extremity and chronic leg edema, presented to  [facility] with retrosternal chest discomfort and chest  Burning with radiation to the upper back, was found to have abnormal EKG and positive serum troponin.  On presentation to the emergency room, was started on IV heparin and received aspirin and NTG  with resolution of pain and was also started on IV nitroglycerin and transferred to [facility] for further evaluation and management. Chest x-ray was normal without mediastinal enlargement. Chest pain started on Monday (1 day PTA) and due to persistent pain presented to ED. Associated with marked dyspnea.   Since being in the hospital, she has not had any further chest pain.  She is presently doing well.no other associated symptoms, there is no history of diabetes, family history of premature coronary artery disease. She has never used any tobacco products, drinks occasional alcohol.  Past Medical History:  Diagnosis Date  . Mixed hyperlipidemia 08/28/2018    Past Surgical History:  Procedure Laterality Date  . APPENDECTOMY    . CHOLECYSTECTOMY    . SHOULDER SURGERY Right   . TUBAL LIGATION      Family History  Problem Relation Age of Onset  . Diabetes Father   . Heart disease Father   . Hyperlipidemia Father   . Hypertension Father   . Varicose Veins Father   . Hypertension Brother    Social History:  reports that she has never smoked. She does not have any smokeless tobacco history on file. She reports current alcohol use of about 1.0 standard drinks of alcohol per week. She reports that she does not use drugs.  Allergies: No Known Allergies  Review of Systems  Constitutional: Negative for malaise/fatigue and weight loss.  Respiratory: Negative for cough, hemoptysis and shortness of breath.    Cardiovascular: Positive for chest pain and leg swelling. Negative for palpitations and claudication.  Gastrointestinal: Positive for heartburn (occasional). Negative for abdominal pain, blood in stool, constipation and vomiting.  Genitourinary: Negative for dysuria.  Musculoskeletal: Negative for joint pain and myalgias.  Neurological: Negative for dizziness, focal weakness and headaches.  Endo/Heme/Allergies: Does not bruise/bleed easily.  Psychiatric/Behavioral: Negative for depression. The patient is not nervous/anxious.   All other systems reviewed and are negative.   Blood pressure 113/86, pulse 65, temperature 98 F (36.7 C), temperature source Oral, resp. rate 11, height 5\' 6"  (1.676 m), weight 89.7 kg, SpO2 97 %.  Physical Exam  Constitutional: She appears well-nourished. No distress.  Moderately built, mildly obese  HENT:  Head: Atraumatic.  Eyes: Conjunctivae are normal.  Neck: Neck supple. No JVD present. No thyromegaly present.  Cardiovascular: Normal rate, regular rhythm, normal heart sounds, intact distal pulses and normal pulses. Exam reveals no gallop.  No murmur heard. Chronic venostasis changes noted in the legs. Mild venous varicosities noted in both legs.   Pulmonary/Chest: Effort normal and breath sounds normal.  Abdominal: Soft. Bowel sounds are normal.  Musculoskeletal: Normal range of motion.        General: No edema.  Neurological: She is alert.  Skin: Skin is warm and dry.  Psychiatric: She has a normal mood and affect.    Labs: CBC Latest Ref Rng & Units 08/28/2018  WBC 4.0 - 10.5 K/uL 6.9  Hemoglobin 12.0 - 15.0 g/dL 10.7(L)  Hematocrit 36.0 -  46.0 % 33.8(L)  Platelets 150 - 400 K/uL 190   CMP Latest Ref Rng & Units 08/28/2018  Glucose 70 - 99 mg/dL 94  BUN 8 - 23 mg/dL 17  Creatinine 0.44 - 1.00 mg/dL 0.99  Sodium 135 - 145 mmol/L 137  Potassium 3.5 - 5.1 mmol/L 3.3(L)  Chloride 98 - 111 mmol/L 98  CO2 22 - 32 mmol/L 24  Calcium 8.9 - 10.3  mg/dL 9.5   BMP Latest Ref Rng & Units 08/28/2018  Glucose 70 - 99 mg/dL 94  BUN 8 - 23 mg/dL 17  Creatinine 0.44 - 1.00 mg/dL 0.99  Sodium 135 - 145 mmol/L 137  Potassium 3.5 - 5.1 mmol/L 3.3(L)  Chloride 98 - 111 mmol/L 98  CO2 22 - 32 mmol/L 24  Calcium 8.9 - 10.3 mg/dL 9.5    Cardiac Panel (last 3 results) Recent Labs    08/27/18 2024 08/28/18 0247  TROPONINI 0.18* 0.17*    BNP (last 3 results) No results for input(s): BNP in the last 8760 hours.  Lipid Panel     Component Value Date/Time   CHOL 216 (H) 08/27/2018 2024   TRIG 74 08/27/2018 2024   HDL 85 08/27/2018 2024   CHOLHDL 2.5 08/27/2018 2024   VLDL 15 08/27/2018 2024   LDLCALC 116 (H) 08/27/2018 2024    HEMOGLOBIN A1C No results found for: HGBA1C, MPG   TSH No results for input(s): TSH in the last 8760 hours.   Medications Prior to Admission  Medication Sig Dispense Refill  . amLODipine-olmesartan (AZOR) 10-40 MG per tablet Take 1 tablet by mouth daily.    Marland Kitchen aspirin 81 MG tablet Take 81 mg by mouth daily.    . metoprolol succinate (TOPROL-XL) 100 MG 24 hr tablet Take 100 mg by mouth 2 (two) times daily. Take with or immediately following a meal. Take half a pill BID    . pantoprazole (PROTONIX) 40 MG tablet Take 40 mg by mouth daily.        Current Facility-Administered Medications:  .  0.9 %  sodium chloride infusion, 250 mL, Intravenous, PRN, Adrian Prows, MD .  [EXPIRED] 0.9% sodium chloride infusion, 3 mL/kg/hr, Intravenous, Continuous, Stopped at 08/28/18 0520 **FOLLOWED BY** 0.9% sodium chloride infusion, 1 mL/kg/hr, Intravenous, Continuous, Adrian Prows, MD, Last Rate: 89.7 mL/hr at 08/28/18 0520, 1 mL/kg/hr at 08/28/18 0520 .  acetaminophen (TYLENOL) tablet 650 mg, 650 mg, Oral, Q4H PRN, Adrian Prows, MD .  aspirin EC tablet 81 mg, 81 mg, Oral, Daily, Adrian Prows, MD .  atorvastatin (LIPITOR) tablet 80 mg, 80 mg, Oral, q1800, Adrian Prows, MD .  carvedilol (COREG) tablet 6.25 mg, 6.25 mg, Oral, BID  WC, Adrian Prows, MD .  heparin ADULT infusion 100 units/mL (25000 units/271mL sodium chloride 0.45%), 900 Units/hr, Intravenous, Continuous, Adrian Prows, MD, Last Rate: 9 mL/hr at 08/28/18 0500, 900 Units/hr at 08/28/18 0500 .  nitroGLYCERIN (NITROSTAT) SL tablet 0.4 mg, 0.4 mg, Sublingual, Q5 Min x 3 PRN, Adrian Prows, MD .  nitroGLYCERIN 50 mg in dextrose 5 % 250 mL (0.2 mg/mL) infusion, 10 mcg/min, Intravenous, Titrated, Adrian Prows, MD .  ondansetron (ZOFRAN) injection 4 mg, 4 mg, Intravenous, Q6H PRN, Adrian Prows, MD .  sodium chloride flush (NS) 0.9 % injection 3 mL, 3 mL, Intravenous, Q12H, Adrian Prows, MD .  sodium chloride flush (NS) 0.9 % injection 3 mL, 3 mL, Intravenous, PRN, Adrian Prows, MD  CARDIAC STUDIES:  EKG 08/27/2018: Normal sinus rhythm, normal axis, T wave inversion noted  in anterolateral lead and nonspecific T abnormality in the inferior leads, cannot exclude ischemia.  Prolonged QT.  Abnormal EKG.  Assessment/Plan 1.  Non-ST elevation myocardial infarction 2.  Essential hypertension 3.  Mixed hyperlipidemia 4.  Chronic venous insufficiency  Recommendation: Patient presenting with unstable angina and non-ST elevation myocardial infarctions with abnormal EKG and also positive serum troponin, remains chest pain-free.  However the EKG abnormality suggest proximal LAD stenosis, we'll schedule her for cardiac catheterization this morning.  I'll since regarding cardiac catheterization were answered.  Patient previously on amlodipine, Coreg was started yesterday, we will continue the same for now and consider addition of an ARB (was on Exforge 10/40 mg on admission).  We will increase the dose of Coreg to 25 mg p.o. b.i.d. as she was on maximum dose of metoprolol succinate. Renal function appears to be mildly depressed but may be related to relative dehydration yesterday as she was in the n.p.o. status.  Schedule for cardiac catheterization, and possible angioplasty. We discussed  regarding risks, benefits, alternatives to this including stress testing, CTA and continued medical therapy. Patient wants to proceed. Understands <1-2% risk of death, stroke, MI, urgent CABG, bleeding, infection, renal failure but not limited to these.   Adrian Prows, MD 08/28/2018, 6:52 AM [facility] [place] Pager: [phone] Office: [phone] If no answer: Cell:  [phone]

## 2018-08-28 NOTE — Progress Notes (Signed)
[place] for heparin Indication: chest pain/ACS  No Known Allergies  Patient Measurements: Height: 5\' 6"  (167.6 cm) Weight: 197 lb 12 oz (89.7 kg) IBW/kg (Calculated) : 59.3 Heparin Dosing Weight: 78.8 kg  Vital Signs: Temp: 98.1 F (36.7 C) (04/01 0724) Temp Source: Oral (04/01 0724) BP: 123/77 (04/01 0724) Pulse Rate: 65 (04/01 0724)  Labs: Recent Labs    08/27/18 2024 08/28/18 0247  HGB  --  10.7*  HCT  --  33.8*  PLT  --  190  HEPARINUNFRC 0.81* 0.40  CREATININE  --  0.99  TROPONINI 0.18* 0.17*    Estimated Creatinine Clearance: 62.2 mL/min (by C-G formula based on SCr of 0.99 mg/dL).   Medical History: Past Medical History:  Diagnosis Date  . Mixed hyperlipidemia 08/28/2018    Medications:  Scheduled:  . aspirin EC  81 mg Oral Daily  . atorvastatin  80 mg Oral q1800  . carvedilol  6.25 mg Oral BID WC  . potassium chloride  20 mEq Oral Once  . sodium chloride flush  3 mL Intravenous Q12H    Assessment: 46 yof transferred from [facility], started on heparin infusion for ACS. No records of AC PTA.   -heparin level is at goal on 900 units/hr  Goal of Therapy:  Heparin level 0.3-0.7 units/ml Monitor platelets by anticoagulation protocol: Yes   Plan:  Reduce heparin infusion slightly to 900 units/hr Daily HL, CBC  Hildred Laser, PharmD Clinical Pharmacist **Pharmacist phone directory can now be found on [email or web address] ([email or web address]).  Listed under [place].

## 2018-08-29 ENCOUNTER — Encounter (HOSPITAL_COMMUNITY): Payer: Self-pay | Admitting: Cardiology

## 2018-08-30 MED FILL — Heparin Sod (Porcine) in NaCl IV Soln 25000 Unit/250ML-0.45%: INTRAVENOUS | Qty: 250 | Status: AC

## 2018-09-06 ENCOUNTER — Telehealth: Payer: Self-pay | Admitting: Cardiology

## 2018-09-06 NOTE — Telephone Encounter (Signed)
Virtual Visit Pre-Appointment Phone Call  Steps For Call:  1. Confirm consent - "In the setting of the current Covid19 crisis, you are scheduled for a (phone or video) visit with your provider on (date) at (time).  Just as we do with many in-office visits, in order for you to participate in this visit, we must obtain consent.  If you'd like, I can send this to your mychart (if signed up) or email for you to review.  Otherwise, I can obtain your verbal consent now.  All virtual visits are billed to your insurance company just like a normal visit would be.  By agreeing to a virtual visit, we'd like you to understand that the technology does not allow for your provider to perform an examination, and thus may limit your provider's ability to fully assess your condition.  Finally, though the technology is pretty good, we cannot assure that it will always work on either your or our end, and in the setting of a video visit, we may have to convert it to a phone-only visit.  In either situation, we cannot ensure that we have a secure connection.  Are you willing to proceed?"  2. Give patient instructions for WebEx download to smartphone as below if video visit  3. Advise patient to be prepared with any vital sign or heart rhythm information, their current medicines, and a piece of paper and pen handy for any instructions they may receive the day of their visit  4. Inform patient they will receive a phone call 15 minutes prior to their appointment time (may be from unknown caller ID) so they should be prepared to answer  5. Confirm that appointment type is correct in Epic appointment notes (video vs telephone)    TELEPHONE CALL NOTE  Athenia Rys has been deemed a candidate for a follow-up tele-health visit to limit community exposure during the Covid-19 pandemic. I spoke with the patient via phone to ensure availability of phone/video source, confirm preferred email & phone number, and discuss  instructions and expectations.  I reminded Kariya Lavergne to be prepared with any vital sign and/or heart rhythm information that could potentially be obtained via home monitoring, at the time of her visit. I reminded Rainelle Sulewski to expect a phone call at the time of her visit if her visit.  Did the patient verbally acknowledge consent to treatment? Yes   Chanda Busing 09/06/2018 10:03 AM   DOWNLOADING THE [place]  - If Apple, go to [employer] and type in WebEx in the search bar. [employer], the blue/green circle. The app is free but as with any other app downloads, their phone may require them to verify saved payment information or Apple password. The patient does NOT have to create an account.  - If Android, ask patient to go to [employer] and type in WebEx in the search bar. [place] [employer], the blue/green circle. The app is free but as with any other app downloads, their phone may require them to verify saved payment information or Android password. The patient does NOT have to create an account.   CONSENT FOR TELE-HEALTH VISIT - PLEASE REVIEW  I hereby voluntarily request, consent and authorize [facility] and its employed or contracted physicians, physician assistants, nurse practitioners or other licensed health care professionals (the Practitioner), to provide me with telemedicine health care services (the Services") as deemed necessary by the treating Practitioner.  I acknowledge and consent to receive the Services by the Practitioner via telemedicine. I understand that the telemedicine visit will involve communicating with the Practitioner through live audiovisual communication technology and the disclosure of certain medical information by electronic transmission. I acknowledge that I have been given the opportunity to request an in-person assessment or other available alternative prior to the telemedicine visit and  am voluntarily participating in the telemedicine visit.  I understand that I have the right to withhold or withdraw my consent to the use of telemedicine in the course of my care at any time, without affecting my right to future care or treatment, and that the Practitioner or I may terminate the telemedicine visit at any time. I understand that I have the right to inspect all information obtained and/or recorded in the course of the telemedicine visit and may receive copies of available information for a reasonable fee.  I understand that some of the potential risks of receiving the Services via telemedicine include:   Delay or interruption in medical evaluation due to technological equipment failure or disruption;  Information transmitted may not be sufficient (e.g. poor resolution of images) to allow for appropriate medical decision making by the Practitioner; and/or   In rare instances, security protocols could fail, causing a breach of personal health information.  Furthermore, I acknowledge that it is my responsibility to provide information about my medical history, conditions and care that is complete and accurate to the best of my ability. I acknowledge that Practitioner's advice, recommendations, and/or decision may be based on factors not within their control, such as incomplete or inaccurate data provided by me or distortions of diagnostic images or specimens that may result from electronic transmissions. I understand that the practice of medicine is not an exact science and that Practitioner makes no warranties or guarantees regarding treatment outcomes. I acknowledge that I will receive a copy of this consent concurrently upon execution via email to the email address I last provided but may also request a printed copy by calling the office of [facility].    I understand that my insurance will be billed for this visit.   I have read or had this consent read to me.  I understand the  contents of this consent, which adequately explains the benefits and risks of the Services being provided via telemedicine.   I have been provided ample opportunity to ask questions regarding this consent and the Services and have had my questions answered to my satisfaction.  I give my informed consent for the services to be provided through the use of telemedicine in my medical care  By participating in this telemedicine visit I agree to the above.

## 2018-09-11 ENCOUNTER — Encounter: Payer: Self-pay | Admitting: Cardiology

## 2018-09-11 ENCOUNTER — Telehealth (INDEPENDENT_AMBULATORY_CARE_PROVIDER_SITE_OTHER): Payer: Medicare HMO | Admitting: Cardiology

## 2018-09-11 VITALS — BP 128/88 | HR 71 | Ht 66.0 in | Wt 197.0 lb

## 2018-09-11 DIAGNOSIS — Z79899 Other long term (current) drug therapy: Secondary | ICD-10-CM | POA: Diagnosis not present

## 2018-09-11 DIAGNOSIS — R600 Localized edema: Secondary | ICD-10-CM

## 2018-09-11 DIAGNOSIS — I10 Essential (primary) hypertension: Secondary | ICD-10-CM

## 2018-09-11 DIAGNOSIS — R0789 Other chest pain: Secondary | ICD-10-CM | POA: Diagnosis not present

## 2018-09-11 MED ORDER — FUROSEMIDE 40 MG PO TABS: 40.0000 mg | ORAL_TABLET | Freq: Every day | ORAL | 1 refills | Status: DC | PRN

## 2018-09-11 NOTE — Progress Notes (Signed)
Medication Instructions:  Start lasix 40 mg daily as needed for swelling   Labwork: None  Testing/Procedures: none  Follow-Up: Your physician recommends that you schedule a follow-up appointment in: 4 months    Any Other Special Instructions Will Be Listed Below (If Applicable).     If you need a refill on your cardiac medications before your next appointment, please call your pharmacy.

## 2018-09-11 NOTE — Progress Notes (Signed)
Virtual Visit via Telephone Note   This visit type was conducted due to national recommendations for restrictions regarding the COVID-19 Pandemic (e.g. social distancing) in an effort to limit this patient's exposure and mitigate transmission in our community.  Due to her co-morbid illnesses, this patient is at least at moderate risk for complications without adequate follow up.  This format is felt to be most appropriate for this patient at this time.  The patient did not have access to video technology/had technical difficulties with video requiring transitioning to audio format only (telephone).  All issues noted in this document were discussed and addressed.  No physical exam could be performed with this format.  Please refer to the patient's chart for her  consent to telehealth for [facility].   Evaluation Performed:  Follow-up visit  Date:  09/11/2018   ID:  Yesenia Bishop, DOB 07/15/1950, MRN [id number]  Patient Location: Home Provider Location: Office  PCP:  Monico Blitz, MD  Cardiologist:  Dr Carlyle Dolly MD Electrophysiologist:  None   Chief Complaint:  Hospital follow up  History of Present Illness:    Yesenia Bishop is a 68 y.o. female seen today as a new patient for the following medical problems   1. Chest pain - admission to [facility] to Dr Irven Shelling service for chest pain 08/2018 - managed for NSTEMI, peak trop 0.18. EKG deep TWIs V1-V5 - 08/2018 cath only luminal irregularlities, signs of vasospasm. Managed medically - 08/2018 echo LVEF no WMAs, LV poorly visualized, no LVEF reported. I independently reviewed study and LVEF is 60-65%, no WMAs  - denies any recurrent symptoms. No recent SOB/DOE - compliant with meds  Medical therapy with   2. HTN   3. Hyperlipidemia   4. Chronic leg edema - had been chlorthalidone but stopped during recent admission   The patient does not have symptoms concerning for COVID-19 infection (fever, chills, cough, or new  shortness of breath).    Past Medical History:  Diagnosis Date  . Mixed hyperlipidemia 08/28/2018   Past Surgical History:  Procedure Laterality Date  . APPENDECTOMY    . CHOLECYSTECTOMY    . LEFT HEART CATH AND CORONARY ANGIOGRAPHY N/A 08/28/2018   Procedure: LEFT HEART CATH AND CORONARY ANGIOGRAPHY;  Surgeon: Adrian Prows, MD;  Location: [place];  Service: Cardiovascular;  Laterality: N/A;  . SHOULDER SURGERY Right   . TUBAL LIGATION       Current Meds  Medication Sig  . amLODipine-olmesartan (AZOR) 10-40 MG per tablet Take 1 tablet by mouth daily.  Marland Kitchen aspirin 81 MG tablet Take 81 mg by mouth daily.  Marland Kitchen atorvastatin (LIPITOR) 10 MG tablet Take 10 mg by mouth daily at 6 PM.  . carvedilol (COREG) 6.25 MG tablet Take 6.25 mg by mouth daily.  . pantoprazole (PROTONIX) 40 MG tablet Take 40 mg by mouth daily.     Allergies:   Patient has no known allergies.   Social History   Tobacco Use  . Smoking status: Never Smoker  Substance Use Topics  . Alcohol use: Yes    Alcohol/week: 1.0 standard drinks    Types: 1 Glasses of wine per week  . Drug use: No     Family Hx: The patient's family history includes Diabetes in her father; Heart disease in her father; Hyperlipidemia in her father; Hypertension in her brother and father; Varicose Veins in her father.  ROS:   Please see the history of present illness.  All other systems reviewed and are negative.   Prior CV studies:   The following studies were reviewed today:  As reported above in HPI  Labs/Other Tests and Data Reviewed:    EKG:  na  Recent Labs: 08/28/2018: BUN 17; Creatinine, Ser 0.99; Hemoglobin 10.3; Platelets 192; Potassium 3.3; Sodium 137   Recent Lipid Panel Lab Results  Component Value Date/Time   CHOL 216 (H) 08/27/2018 08:24 PM   TRIG 74 08/27/2018 08:24 PM   HDL 85 08/27/2018 08:24 PM   CHOLHDL 2.5 08/27/2018 08:24 PM   LDLCALC 116 (H) 08/27/2018 08:24 PM    Wt Readings from Last 3  Encounters:  08/27/18 197 lb 12 oz (89.7 kg)  03/17/14 210 lb (95.3 kg)     Objective:    Vital Signs:  128/88 p 71 Wt 197 lbs  Normal affect. Normal speech patterns, comfortable in no distress. No auditory sounds of heavy breathing or wheezing.   ASSESSMENT & PLAN:    1. Chest pain - recent admission as reported above. No obstructive CAD, suspected vasospasm - no recurrent symptoms, continue medical therapy. If recurrent symptoms consider imdur  2. LE edema - increasing since chlorthalidone was stopped during admission - we will start lasix 40mg  prn swelling  3. HTN - at goal, continue current meds   COVID-19 Education: The signs and symptoms of COVID-19 were discussed with the patient and how to seek care for testing (follow up with PCP or arrange E-visit).  The importance of social distancing was discussed today.  Time:   Today, I have spent 30 minutes with the patient with telehealth technology discussing the above problems.     Medication Adjustments/Labs and Tests Ordered: Current medicines are reviewed at length with the patient today.  Concerns regarding medicines are outlined above.   Tests Ordered: No orders of the defined types were placed in this encounter.   Medication Changes: No orders of the defined types were placed in this encounter.   Disposition:  Follow up 4 months  Signed, Carlyle Dolly, MD  09/11/2018 11:31 AM    [place]

## 2019-01-23 ENCOUNTER — Telehealth: Payer: Self-pay | Admitting: *Deleted

## 2019-01-23 NOTE — Telephone Encounter (Signed)
Patient verbally consented for telehealth visits with [facility] and understands that her insurance company will be billed for the encounter.   Will have BP & HR but unable to check weight

## 2019-01-27 ENCOUNTER — Telehealth (INDEPENDENT_AMBULATORY_CARE_PROVIDER_SITE_OTHER): Payer: Medicare HMO | Admitting: Cardiology

## 2019-01-27 ENCOUNTER — Encounter: Payer: Self-pay | Admitting: Cardiology

## 2019-01-27 ENCOUNTER — Encounter: Payer: Self-pay | Admitting: *Deleted

## 2019-01-27 VITALS — BP 134/94 | HR 72 | Ht 66.0 in | Wt 200.0 lb

## 2019-01-27 DIAGNOSIS — R0789 Other chest pain: Secondary | ICD-10-CM

## 2019-01-27 DIAGNOSIS — E782 Mixed hyperlipidemia: Secondary | ICD-10-CM | POA: Diagnosis not present

## 2019-01-27 DIAGNOSIS — I10 Essential (primary) hypertension: Secondary | ICD-10-CM

## 2019-01-27 DIAGNOSIS — R600 Localized edema: Secondary | ICD-10-CM

## 2019-01-27 NOTE — Patient Instructions (Signed)

## 2019-01-27 NOTE — Progress Notes (Signed)
Virtual Visit via Telephone Note   This visit type was conducted due to national recommendations for restrictions regarding the COVID-19 Pandemic (e.g. social distancing) in an effort to limit this patient's exposure and mitigate transmission in our community.  Due to her co-morbid illnesses, this patient is at least at moderate risk for complications without adequate follow up.  This format is felt to be most appropriate for this patient at this time.  The patient did not have access to video technology/had technical difficulties with video requiring transitioning to audio format only (telephone).  All issues noted in this document were discussed and addressed.  No physical exam could be performed with this format.  Please refer to the patient's chart for her  consent to telehealth for [facility].   Date:  01/27/2019   ID:  Yesenia Bishop, DOB [birth year], MRN [id number]  Patient Location: Home Provider Location: Office  PCP:  Monico Blitz, MD  Cardiologist:  Carlyle Dolly, MD  Electrophysiologist:  None   Evaluation Performed:  Follow-Up Visit  Chief Complaint:  Follow up visit  History of Present Illness:    Yesenia Bishop is a 68 y.o. female seen today as a new patient for the following medical problems   1. Chest pain - admission to [facility] to Dr Irven Shelling service for chest pain 08/2018 - managed for NSTEMI, peak trop 0.18. EKG deep TWIs V1-V5 - 08/2018 cath only luminal irregularlities, signs of vasospasm. Managed medically - 08/2018 echo LVEF no WMAs, LV poorly visualized, no LVEF reported. I independently reviewed study and LVEF is 60-65%, no WMAs   - no recent chest pain. Denies any SOB/DOE - compliant with emds   2. HTN - compliant with meds, had just taken meds this morning when she checked  3. Hyperlipidemia - recent labs with pcp - compliant with statin  4. Chronic leg edema - had been chlorthalidone but stopped during recent admission. Last  visit started prn lasix.  - swelling up and down. Takes lasix just prn, on average about 3 times a month   The patient does not have symptoms concerning for COVID-19 infection (fever, chills, cough, or new shortness of breath).    Past Medical History:  Diagnosis Date  . Mixed hyperlipidemia 08/28/2018   Past Surgical History:  Procedure Laterality Date  . APPENDECTOMY    . CHOLECYSTECTOMY    . LEFT HEART CATH AND CORONARY ANGIOGRAPHY N/A 08/28/2018   Procedure: LEFT HEART CATH AND CORONARY ANGIOGRAPHY;  Surgeon: Adrian Prows, MD;  Location: [facility] [facility];  Service: Cardiovascular;  Laterality: N/A;  . SHOULDER SURGERY Right   . TUBAL LIGATION       Current Meds  Medication Sig  . amLODipine-olmesartan (AZOR) 10-40 MG per tablet Take 1 tablet by mouth daily.  Marland Kitchen aspirin 81 MG tablet Take 81 mg by mouth daily.  Marland Kitchen atorvastatin (LIPITOR) 10 MG tablet Take 10 mg by mouth daily at 6 PM.  . nitroGLYCERIN (NITROSTAT) 0.4 MG SL tablet Place 1 tablet (0.4 mg total) under the tongue every 5 (five) minutes x 3 doses as needed for chest pain.  . pantoprazole (PROTONIX) 40 MG tablet Take 40 mg by mouth daily.  . [DISCONTINUED] carvedilol (COREG) 6.25 MG tablet Take 6.25 mg by mouth daily.     Allergies:   Patient has no known allergies.   Social History   Tobacco Use  . Smoking status: Never Smoker  . Smokeless tobacco: Never Used  Substance Use Topics  .  Alcohol use: Yes    Alcohol/week: 1.0 standard drinks    Types: 1 Glasses of wine per week  . Drug use: No     Family Hx: The patient's family history includes Diabetes in her father; Heart disease in her father; Hyperlipidemia in her father; Hypertension in her brother and father; Varicose Veins in her father.  ROS:   Please see the history of present illness.     All other systems reviewed and are negative.   Prior CV studies:   The following studies were reviewed today:   Labs/Other Tests and Data Reviewed:    EKG:   No ECG reviewed.  Recent Labs: 08/28/2018: BUN 17; Creatinine, Ser 0.99; Hemoglobin 10.3; Platelets 192; Potassium 3.3; Sodium 137   Recent Lipid Panel Lab Results  Component Value Date/Time   CHOL 216 (H) 08/27/2018 08:24 PM   TRIG 74 08/27/2018 08:24 PM   HDL 85 08/27/2018 08:24 PM   CHOLHDL 2.5 08/27/2018 08:24 PM   LDLCALC 116 (H) 08/27/2018 08:24 PM    Wt Readings from Last 3 Encounters:  01/27/19 200 lb (90.7 kg)  09/11/18 197 lb (89.4 kg)  08/27/18 197 lb 12 oz (89.7 kg)     Objective:    Vital Signs:  BP (!) 134/94   Pulse 72   Ht 5\' 6"  (1.676 m)   Wt 200 lb (90.7 kg)   BMI 32.28 kg/m    Today's Vitals   01/27/19 1039  BP: (!) 134/94  Pulse: 72  Weight: 200 lb (90.7 kg)  Height: 5\' 6"  (1.676 m)   Body mass index is 32.28 kg/m.  Normal affect. Normal speech pattern and tone. Comfortable, no apparent distress. No audible signs of SOB or wheezing.   ASSESSMENT & PLAN:    1. Chest pain - no recent symptoms, continue to monitor.   2. LE edema -continue prn lasix, overall controlled  3. HTN - mildly elevated but just took meds this AM, continue to monitor  4. Hyperlipidemia - request pcp labs, continue statin.   COVID-19 Education: The signs and symptoms of COVID-19 were discussed with the patient and how to seek care for testing (follow up with PCP or arrange E-visit).  The importance of social distancing was discussed today.  Time:   Today, I have spent 18 minutes with the patient with telehealth technology discussing the above problems.     Medication Adjustments/Labs and Tests Ordered: Current medicines are reviewed at length with the patient today.  Concerns regarding medicines are outlined above.   Tests Ordered: No orders of the defined types were placed in this encounter.   Medication Changes: No orders of the defined types were placed in this encounter.   Follow Up:  In Person in 6 month(s)  Signed, Carlyle Dolly, MD   01/27/2019 11:04 AM    [place]

## 2019-03-03 ENCOUNTER — Other Ambulatory Visit: Payer: Self-pay | Admitting: Cardiology

## 2019-07-09 ENCOUNTER — Encounter: Payer: Self-pay | Admitting: Gastroenterology

## 2019-07-27 NOTE — H&P (View-Only) (Signed)
Primary Care Physician:  Monico Blitz, MD  Primary Gastroenterologist:  Garfield Cornea, MD   Chief Complaint  Patient presents with  . Gastroesophageal Reflux    C/O VOMITING during the night    HPI:  Yesenia Bishop is a 69 y.o. female here at the request of Dr. Manuella Ghazi for further evaluation of GERD.   Patient complains of nocturnal regurgitation. Happening frequently over the past couple of months. Bends over and feels stuff coming up. No heartburn. A lot of gas. Dysphagia to solids and liquids. Doesn't matter what she eats. No problems with pills. LUQ pain, intermittent for one year. Not meal related. Has a lot of problems with constipation. Takes miralax (several times per week) and prune juice. No melena, brbpr. BM only twice per week. No ASA powders, NSAIDS. Tried pantoprazole without relief. No unintentional weight loss.   Last colonoscopy several years back, possibly at [facility]. No prior upper enodscopy.   Current Outpatient Medications  Medication Sig Dispense Refill  . amLODipine-olmesartan (AZOR) 10-40 MG per tablet Take 1 tablet by mouth daily.    Marland Kitchen aspirin 81 MG tablet Take 81 mg by mouth daily.    Marland Kitchen atorvastatin (LIPITOR) 10 MG tablet Take 10 mg by mouth daily at 6 PM.    . carvedilol (COREG) 6.25 MG tablet Take 6.25 mg by mouth daily.    . nitroGLYCERIN (NITROSTAT) 0.4 MG SL tablet Place 1 tablet (0.4 mg total) under the tongue every 5 (five) minutes x 3 doses as needed for chest pain. 25 tablet 1   No current facility-administered medications for this visit.    Allergies as of 07/28/2019  . (No Known Allergies)    Past Medical History:  Diagnosis Date  . HTN (hypertension)   . Mixed hyperlipidemia 08/28/2018    Past Surgical History:  Procedure Laterality Date  . 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 APPENDECTOMY    . CHOLECYSTECTOMY    . LEFT HEART CATH AND CORONARY ANGIOGRAPHY N/A 08/28/2018   Procedure: LEFT HEART CATH AND CORONARY  ANGIOGRAPHY;  Surgeon: Adrian Prows, MD;  Location: [place] [facility];  Service: Cardiovascular;  Laterality: N/A;  . SHOULDER SURGERY Right   . TUBAL LIGATION      Family History  Problem Relation Age of Onset  . Diabetes Father   . Heart disease Father   . Hyperlipidemia Father   . Hypertension Father   . Varicose Veins Father   . Hypertension Brother     Social History   Socioeconomic History  . Marital status: Widowed    Spouse name: Not on file  . Number of children: Not on file  . Years of education: Not on file  . Highest education level: Not on file  Occupational History  . Not on file  Tobacco Use  . Smoking status: Never Smoker  . Smokeless tobacco: Never Used  Substance and Sexual Activity  . Alcohol use: Yes    Alcohol/week: 1.0 standard drinks    Types: 1 Glasses of wine per week    Comment: socially  . Drug use: No  . Sexual activity: Not on file  Other Topics Concern  . Not on file  Social History Narrative  . Not on file   Social Determinants of Health   Financial Resource Strain:   . Difficulty of Paying Living Expenses: Not on file  Food Insecurity:   . Worried About Charity fundraiser in the Last Year: Not  on file  . Ran Out of Food in the Last Year: Not on file  Transportation Needs:   . Lack of Transportation (Medical): Not on file  . Lack of Transportation (Non-Medical): Not on file  Physical Activity:   . Days of Exercise per Week: Not on file  . Minutes of Exercise per Session: Not on file  Stress:   . Feeling of Stress : Not on file  Social Connections:   . Frequency of Communication with Friends and Family: Not on file  . Frequency of Social Gatherings with Friends and Family: Not on file  . Attends Religious Services: Not on file  . Active Member of Clubs or Organizations: Not on file  . Attends Archivist Meetings: Not on file  . Marital Status: Not on file  Intimate Partner Violence:   . Fear of Current or  Ex-Partner: Not on file  . Emotionally Abused: Not on file  . Physically Abused: Not on file  . Sexually Abused: Not on file      ROS:  General: Negative for anorexia, weight loss, fever, chills, fatigue, weakness. Eyes: Negative for vision changes.  ENT: Negative for hoarseness, difficulty swallowing , nasal congestion. CV: Negative for chest pain, angina, palpitations, dyspnea on exertion, peripheral edema.  Respiratory: Negative for dyspnea at rest, dyspnea on exertion, cough, sputum, wheezing.  GI: See history of present illness. GU:  Negative for dysuria, hematuria, urinary incontinence, urinary frequency, nocturnal urination.  MS: Negative for joint pain, low back pain.  Derm: Negative for rash or itching.  Neuro: Negative for weakness, abnormal sensation, seizure, frequent headaches, memory loss, confusion.  Psych: Negative for anxiety, depression, suicidal ideation, hallucinations.  Endo: Negative for unusual weight change.  Heme: Negative for bruising or bleeding. Allergy: Negative for rash or hives.    Physical Examination:  BP (!) 146/98   Pulse 75   Temp (!) 97 F (36.1 C) (Oral)   Ht 5\' 7"  (1.702 m)   Wt 206 lb 9.6 oz (93.7 kg)   BMI 32.36 kg/m    General: Well-nourished, well-developed in no acute distress.  Head: Normocephalic, atraumatic.   Eyes: Conjunctiva pink, no icterus. Mouth:masked Neck: Supple without thyromegaly, masses, or lymphadenopathy.  Lungs: Clear to auscultation bilaterally.  Heart: Regular rate and rhythm, no murmurs rubs or gallops.  Abdomen: Bowel sounds are normal, nontender, nondistended, no hepatosplenomegaly or masses, no abdominal bruits or    hernia , no rebound or guarding.   Rectal: not performed Extremities: No lower extremity edema. No clubbing or deformities.  Neuro: Alert and oriented x 4 , grossly normal neurologically.  Skin: Warm and dry, no rash or jaundice.   Psych: Alert and cooperative, normal mood and  affect.  Imaging Studies: No results found.

## 2019-07-27 NOTE — Progress Notes (Signed)
Primary Care Physician:  Monico Blitz, MD  Primary Gastroenterologist:  Garfield Cornea, MD   Chief Complaint  Patient presents with  . Gastroesophageal Reflux    C/O VOMITING during the night    HPI:  Yesenia Bishop is a 69 y.o. female here at the request of Dr. Manuella Ghazi for further evaluation of GERD.   Patient complains of nocturnal regurgitation. Happening frequently over the past couple of months. Bends over and feels stuff coming up. No heartburn. A lot of gas. Dysphagia to solids and liquids. Doesn't matter what she eats. No problems with pills. LUQ pain, intermittent for one year. Not meal related. Has a lot of problems with constipation. Takes miralax (several times per week) and prune juice. No melena, brbpr. BM only twice per week. No ASA powders, NSAIDS. Tried pantoprazole without relief. No unintentional weight loss.   Last colonoscopy several years back, possibly at [facility]. No prior upper enodscopy.   Current Outpatient Medications  Medication Sig Dispense Refill  . amLODipine-olmesartan (AZOR) 10-40 MG per tablet Take 1 tablet by mouth daily.    Marland Kitchen aspirin 81 MG tablet Take 81 mg by mouth daily.    Marland Kitchen atorvastatin (LIPITOR) 10 MG tablet Take 10 mg by mouth daily at 6 PM.    . carvedilol (COREG) 6.25 MG tablet Take 6.25 mg by mouth daily.    . nitroGLYCERIN (NITROSTAT) 0.4 MG SL tablet Place 1 tablet (0.4 mg total) under the tongue every 5 (five) minutes x 3 doses as needed for chest pain. 25 tablet 1   No current facility-administered medications for this visit.    Allergies as of 07/28/2019  . (No Known Allergies)    Past Medical History:  Diagnosis Date  . HTN (hypertension)   . Mixed hyperlipidemia 08/28/2018    Past Surgical History:  Procedure Laterality Date  . 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 APPENDECTOMY    . CHOLECYSTECTOMY    . LEFT HEART CATH AND CORONARY ANGIOGRAPHY N/A 08/28/2018   Procedure: LEFT HEART CATH AND CORONARY  ANGIOGRAPHY;  Surgeon: Adrian Prows, MD;  Location: [facility];  Service: Cardiovascular;  Laterality: N/A;  . SHOULDER SURGERY Right   . TUBAL LIGATION      Family History  Problem Relation Age of Onset  . Diabetes Father   . Heart disease Father   . Hyperlipidemia Father   . Hypertension Father   . Varicose Veins Father   . Hypertension Brother     Social History   Socioeconomic History  . Marital status: Widowed    Spouse name: Not on file  . Number of children: Not on file  . Years of education: Not on file  . Highest education level: Not on file  Occupational History  . Not on file  Tobacco Use  . Smoking status: Never Smoker  . Smokeless tobacco: Never Used  Substance and Sexual Activity  . Alcohol use: Yes    Alcohol/week: 1.0 standard drinks    Types: 1 Glasses of wine per week    Comment: socially  . Drug use: No  . Sexual activity: Not on file  Other Topics Concern  . Not on file  Social History Narrative  . Not on file   Social Determinants of Health   Financial Resource Strain:   . Difficulty of Paying Living Expenses: Not on file  Food Insecurity:   . Worried About Charity fundraiser in the Last Year: Not  on file  . Ran Out of Food in the Last Year: Not on file  Transportation Needs:   . Lack of Transportation (Medical): Not on file  . Lack of Transportation (Non-Medical): Not on file  Physical Activity:   . Days of Exercise per Week: Not on file  . Minutes of Exercise per Session: Not on file  Stress:   . Feeling of Stress : Not on file  Social Connections:   . Frequency of Communication with Friends and Family: Not on file  . Frequency of Social Gatherings with Friends and Family: Not on file  . Attends Religious Services: Not on file  . Active Member of Clubs or Organizations: Not on file  . Attends Archivist Meetings: Not on file  . Marital Status: Not on file  Intimate Partner Violence:   . Fear of Current or  Ex-Partner: Not on file  . Emotionally Abused: Not on file  . Physically Abused: Not on file  . Sexually Abused: Not on file      ROS:  General: Negative for anorexia, weight loss, fever, chills, fatigue, weakness. Eyes: Negative for vision changes.  ENT: Negative for hoarseness, difficulty swallowing , nasal congestion. CV: Negative for chest pain, angina, palpitations, dyspnea on exertion, peripheral edema.  Respiratory: Negative for dyspnea at rest, dyspnea on exertion, cough, sputum, wheezing.  GI: See history of present illness. GU:  Negative for dysuria, hematuria, urinary incontinence, urinary frequency, nocturnal urination.  MS: Negative for joint pain, low back pain.  Derm: Negative for rash or itching.  Neuro: Negative for weakness, abnormal sensation, seizure, frequent headaches, memory loss, confusion.  Psych: Negative for anxiety, depression, suicidal ideation, hallucinations.  Endo: Negative for unusual weight change.  Heme: Negative for bruising or bleeding. Allergy: Negative for rash or hives.    Physical Examination:  BP (!) 146/98   Pulse 75   Temp (!) 97 F (36.1 C) (Oral)   Ht 5\' 7"  (1.702 m)   Wt 206 lb 9.6 oz (93.7 kg)   BMI 32.36 kg/m    General: Well-nourished, well-developed in no acute distress.  Head: Normocephalic, atraumatic.   Eyes: Conjunctiva pink, no icterus. Mouth:masked Neck: Supple without thyromegaly, masses, or lymphadenopathy.  Lungs: Clear to auscultation bilaterally.  Heart: Regular rate and rhythm, no murmurs rubs or gallops.  Abdomen: Bowel sounds are normal, nontender, nondistended, no hepatosplenomegaly or masses, no abdominal bruits or    hernia , no rebound or guarding.   Rectal: not performed Extremities: No lower extremity edema. No clubbing or deformities.  Neuro: Alert and oriented x 4 , grossly normal neurologically.  Skin: Warm and dry, no rash or jaundice.   Psych: Alert and cooperative, normal mood and  affect.  Imaging Studies: No results found.

## 2019-07-28 ENCOUNTER — Other Ambulatory Visit: Payer: Self-pay

## 2019-07-28 ENCOUNTER — Encounter: Payer: Self-pay | Admitting: Gastroenterology

## 2019-07-28 ENCOUNTER — Ambulatory Visit (INDEPENDENT_AMBULATORY_CARE_PROVIDER_SITE_OTHER): Payer: Medicare HMO | Admitting: Gastroenterology

## 2019-07-28 VITALS — BP 146/98 | HR 75 | Temp 97.0°F | Ht 67.0 in | Wt 206.6 lb

## 2019-07-28 DIAGNOSIS — R112 Nausea with vomiting, unspecified: Secondary | ICD-10-CM

## 2019-07-28 DIAGNOSIS — R1012 Left upper quadrant pain: Secondary | ICD-10-CM

## 2019-07-28 DIAGNOSIS — R1310 Dysphagia, unspecified: Secondary | ICD-10-CM | POA: Insufficient documentation

## 2019-07-28 DIAGNOSIS — K219 Gastro-esophageal reflux disease without esophagitis: Secondary | ICD-10-CM | POA: Insufficient documentation

## 2019-07-28 DIAGNOSIS — K5900 Constipation, unspecified: Secondary | ICD-10-CM

## 2019-07-28 DIAGNOSIS — R1319 Other dysphagia: Secondary | ICD-10-CM

## 2019-07-28 MED ORDER — LUBIPROSTONE 24 MCG PO CAPS: ORAL_CAPSULE | ORAL | 3 refills | Status: DC

## 2019-07-28 MED ORDER — OMEPRAZOLE 20 MG PO TBEC: 20.0000 mg | DELAYED_RELEASE_TABLET | Freq: Every day | ORAL | 3 refills | Status: DC

## 2019-07-28 NOTE — Assessment & Plan Note (Signed)
Several month history of regurgitation, especially at night time. LUQ pain off/on for one year. Typical heartburn ok but having a lot of gas. C/o esophageal dysphagia. No improvement on pantoprazole once daily. Will switch to omeprazole 20mg  daily before breakfast. Reinforced antireflux measures. Plan for EGD/ED in near future.  I have discussed the risks, alternatives, benefits with regards to but not limited to the risk of reaction to medication, bleeding, infection, perforation and the patient is agreeable to proceed. Written consent to be obtained.

## 2019-07-28 NOTE — Patient Instructions (Signed)
PA for EGD/DIL submitted via [employer]. Case approved. PA# [id number], valid 08/13/19-11/11/19.

## 2019-07-28 NOTE — Patient Instructions (Addendum)
1. Upper endoscopy as scheduled. See separate instructions.  2. Start Amitiza for constipation. Take one capsule once to twice daily with food.  3. Start omeprazole for acid reflux. Take one capsule once daily before breakfast.  4. Make sure you do not eat or drink within 2-3 hours of laying down at night to help prevent food from coming back up.    Gastroesophageal Reflux Disease, Adult Gastroesophageal reflux (GER) happens when acid from the stomach flows up into the tube that connects the mouth and the stomach (esophagus). Normally, food travels down the esophagus and stays in the stomach to be digested. However, when a person has GER, food and stomach acid sometimes move back up into the esophagus. If this becomes a more serious problem, the person may be diagnosed with a disease called gastroesophageal reflux disease (GERD). GERD occurs when the reflux:  Happens often.  Causes frequent or severe symptoms.  Causes problems such as damage to the esophagus. When stomach acid comes in contact with the esophagus, the acid may cause soreness (inflammation) in the esophagus. Over time, GERD may create small holes (ulcers) in the lining of the esophagus. What are the causes? This condition is caused by a problem with the muscle between the esophagus and the stomach (lower esophageal sphincter, or LES). Normally, the LES muscle closes after food passes through the esophagus to the stomach. When the LES is weakened or abnormal, it does not close properly, and that allows food and stomach acid to go back up into the esophagus. The LES can be weakened by certain dietary substances, medicines, and medical conditions, including:  Tobacco use.  Pregnancy.  Having a hiatal hernia.  Alcohol use.  Certain foods and beverages, such as coffee, chocolate, onions, and peppermint. What increases the risk? You are more likely to develop this condition if you:  Have an increased body weight.  Have a  connective tissue disorder.  Use NSAID medicines. What are the signs or symptoms? Symptoms of this condition include:  Heartburn.  Difficult or painful swallowing.  The feeling of having a lump in the throat.  Abitter taste in the mouth.  Bad breath.  Having a large amount of saliva.  Having an upset or bloated stomach.  Belching.  Chest pain. Different conditions can cause chest pain. Make sure you see your health care provider if you experience chest pain.  Shortness of breath or wheezing.  Ongoing (chronic) cough or a night-time cough.  Wearing away of tooth enamel.  Weight loss. How is this diagnosed? Your health care provider will take a medical history and perform a physical exam. To determine if you have mild or severe GERD, your health care provider may also monitor how you respond to treatment. You may also have tests, including:  A test to examine your stomach and esophagus with a small camera (endoscopy).  A test thatmeasures the acidity level in your esophagus.  A test thatmeasures how much pressure is on your esophagus.  A barium swallow or modified barium swallow test to show the shape, size, and functioning of your esophagus. How is this treated? The goal of treatment is to help relieve your symptoms and to prevent complications. Treatment for this condition may vary depending on how severe your symptoms are. Your health care provider may recommend:  Changes to your diet.  Medicine.  Surgery. Follow these instructions at home: Eating and drinking   Follow a diet as recommended by your health care provider. This may involve avoiding  foods and drinks such as: ? Coffee and tea (with or without caffeine). ? Drinks that containalcohol. ? Energy drinks and sports drinks. ? Carbonated drinks or sodas. ? Chocolate and cocoa. ? Peppermint and mint flavorings. ? Garlic and onions. ? Horseradish. ? Spicy and acidic foods, including peppers, chili  powder, curry powder, vinegar, hot sauces, and barbecue sauce. ? Citrus fruit juices and citrus fruits, such as oranges, lemons, and limes. ? Tomato-based foods, such as red sauce, chili, salsa, and pizza with red sauce. ? Fried and fatty foods, such as donuts, french fries, potato chips, and high-fat dressings. ? High-fat meats, such as hot dogs and fatty cuts of red and white meats, such as rib eye steak, sausage, ham, and bacon. ? High-fat dairy items, such as whole milk, butter, and cream cheese.  Eat small, frequent meals instead of large meals.  Avoid drinking large amounts of liquid with your meals.  Avoid eating meals during the 2-3 hours before bedtime.  Avoid lying down right after you eat.  Do not exercise right after you eat. Lifestyle   Do not use any products that contain nicotine or tobacco, such as cigarettes, e-cigarettes, and chewing tobacco. If you need help quitting, ask your health care provider.  Try to reduce your stress by using methods such as yoga or meditation. If you need help reducing stress, ask your health care provider.  If you are overweight, reduce your weight to an amount that is healthy for you. Ask your health care provider for guidance about a safe weight loss goal. General instructions  Pay attention to any changes in your symptoms.  Take over-the-counter and prescription medicines only as told by your health care provider. Do not take aspirin, ibuprofen, or other NSAIDs unless your health care provider told you to do so.  Wear loose-fitting clothing. Do not wear anything tight around your waist that causes pressure on your abdomen.  Raise (elevate) the head of your bed about 6 inches (15 cm).  Avoid bending over if this makes your symptoms worse.  Keep all follow-up visits as told by your health care provider. This is important. Contact a health care provider if:  You have: ? New symptoms. ? Unexplained weight loss. ? Difficulty  swallowing or it hurts to swallow. ? Wheezing or a persistent cough. ? A hoarse voice.  Your symptoms do not improve with treatment. Get help right away if you:  Have pain in your arms, neck, jaw, teeth, or back.  Feel sweaty, dizzy, or light-headed.  Have chest pain or shortness of breath.  Vomit and your vomit looks like blood or coffee grounds.  Faint.  Have stool that is bloody or black.  Cannot swallow, drink, or eat. Summary  Gastroesophageal reflux happens when acid from the stomach flows up into the esophagus. GERD is a disease in which the reflux happens often, causes frequent or severe symptoms, or causes problems such as damage to the esophagus.  Treatment for this condition may vary depending on how severe your symptoms are. Your health care provider may recommend diet and lifestyle changes, medicine, or surgery.  Contact a health care provider if you have new or worsening symptoms.  Take over-the-counter and prescription medicines only as told by your health care provider. Do not take aspirin, ibuprofen, or other NSAIDs unless your health care provider told you to do so.  Keep all follow-up visits as told by your health care provider. This is important. This information is not intended to  replace advice given to you by your health care provider. Make sure you discuss any questions you have with your health care provider. Document Revised: 11/21/2017 Document Reviewed: 11/21/2017 Elsevier Patient Education  2020 [place] for Gastroesophageal Reflux Disease, Adult When you have gastroesophageal reflux disease (GERD), the foods you eat and your eating habits are very important. Choosing the right foods can help ease the discomfort of GERD. Consider working with a diet and nutrition specialist (dietitian) to help you make healthy food choices. What general guidelines should I follow?  Eating plan  Choose healthy foods low in fat, such as  fruits, vegetables, whole grains, low-fat dairy products, and lean meat, fish, and poultry.  Eat frequent, small meals instead of three large meals each day. Eat your meals slowly, in a relaxed setting. Avoid bending over or lying down until 2-3 hours after eating.  Limit high-fat foods such as fatty meats or fried foods.  Limit your intake of oils, butter, and shortening to less than 8 teaspoons each day.  Avoid the following: ? Foods that cause symptoms. These may be different for different people. Keep a food diary to keep track of foods that cause symptoms. ? Alcohol. ? Drinking large amounts of liquid with meals. ? Eating meals during the 2-3 hours before bed.  Cook foods using methods other than frying. This may include baking, grilling, or broiling. Lifestyle  Maintain a healthy weight. Ask your health care provider what weight is healthy for you. If you need to lose weight, work with your health care provider to do so safely.  Exercise for at least 30 minutes on 5 or more days each week, or as told by your health care provider.  Avoid wearing clothes that fit tightly around your waist and chest.  Do not use any products that contain nicotine or tobacco, such as cigarettes and e-cigarettes. If you need help quitting, ask your health care provider.  Sleep with the head of your bed raised. Use a wedge under the mattress or blocks under the bed frame to raise the head of the bed. What foods are not recommended? The items listed may not be a complete list. Talk with your dietitian about what dietary choices are best for you. Grains Pastries or quick breads with added fat. [place] toast. Vegetables Deep fried vegetables. [place] fries. Any vegetables prepared with added fat. Any vegetables that cause symptoms. For some people this may include tomatoes and tomato products, chili peppers, onions and garlic, and horseradish. Fruits Any fruits prepared with added fat. Any fruits that  cause symptoms. For some people this may include citrus fruits, such as oranges, grapefruit, pineapple, and lemons. Meats and other protein foods High-fat meats, such as fatty beef or pork, hot dogs, ribs, ham, sausage, salami and bacon. Fried meat or protein, including fried fish and fried chicken. Nuts and nut butters. Dairy Whole milk and chocolate milk. Sour cream. Cream. Ice cream. Cream cheese. Milk shakes. Beverages Coffee and tea, with or without caffeine. Carbonated beverages. Sodas. Energy drinks. Fruit juice made with acidic fruits (such as orange or grapefruit). Tomato juice. Alcoholic drinks. Fats and oils Butter. Margarine. Shortening. Ghee. Sweets and desserts Chocolate and cocoa. Donuts. Seasoning and other foods Pepper. Peppermint and spearmint. Any condiments, herbs, or seasonings that cause symptoms. For some people, this may include curry, hot sauce, or vinegar-based salad dressings. Summary  When you have gastroesophageal reflux disease (GERD), food and lifestyle choices are very important to  help ease the discomfort of GERD.  Eat frequent, small meals instead of three large meals each day. Eat your meals slowly, in a relaxed setting. Avoid bending over or lying down until 2-3 hours after eating.  Limit high-fat foods such as fatty meat or fried foods. This information is not intended to replace advice given to you by your health care provider. Make sure you discuss any questions you have with your health care provider. Document Revised: 09/05/2018 Document Reviewed: 05/16/2016 Elsevier Patient Education  [place].

## 2019-07-28 NOTE — Assessment & Plan Note (Addendum)
Inadequate control on Miralax. Cannot take daily due to bloating. Trial of Amitiza 68mcg once to twice daily with food.   Obtain copy of last colonoscopy report for review.

## 2019-08-11 ENCOUNTER — Other Ambulatory Visit: Payer: Self-pay

## 2019-08-11 ENCOUNTER — Other Ambulatory Visit (HOSPITAL_COMMUNITY)
Admission: RE | Admit: 2019-08-11 | Discharge: 2019-08-11 | Disposition: A | Discharge status: Home / Self Care | Payer: Medicare Other | Source: Ambulatory Visit | Attending: Internal Medicine | Admitting: Internal Medicine

## 2019-08-11 DIAGNOSIS — Z20822 Contact with and (suspected) exposure to covid-19: Secondary | ICD-10-CM | POA: Diagnosis not present

## 2019-08-11 DIAGNOSIS — Z01812 Encounter for preprocedural laboratory examination: Secondary | ICD-10-CM | POA: Insufficient documentation

## 2019-08-12 LAB — SARS CORONAVIRUS 2 (TAT 6-24 HRS): SARS Coronavirus 2: NEGATIVE

## 2019-08-13 ENCOUNTER — Ambulatory Visit (HOSPITAL_COMMUNITY)
Admission: RE | Admit: 2019-08-13 | Discharge: 2019-08-13 | Disposition: A | Discharge status: Home / Self Care | Payer: Medicare Other | Attending: Internal Medicine | Admitting: Internal Medicine

## 2019-08-13 ENCOUNTER — Other Ambulatory Visit: Payer: Self-pay

## 2019-08-13 ENCOUNTER — Encounter (HOSPITAL_COMMUNITY): Payer: Self-pay | Admitting: Internal Medicine

## 2019-08-13 ENCOUNTER — Encounter (HOSPITAL_COMMUNITY)
Admission: RE | Disposition: A | Discharge status: Home / Self Care | Payer: Self-pay | Source: Home / Self Care | Attending: Internal Medicine

## 2019-08-13 DIAGNOSIS — Z8249 Family history of ischemic heart disease and other diseases of the circulatory system: Secondary | ICD-10-CM | POA: Insufficient documentation

## 2019-08-13 DIAGNOSIS — K317 Polyp of stomach and duodenum: Secondary | ICD-10-CM | POA: Insufficient documentation

## 2019-08-13 DIAGNOSIS — I10 Essential (primary) hypertension: Secondary | ICD-10-CM | POA: Diagnosis not present

## 2019-08-13 DIAGNOSIS — Z833 Family history of diabetes mellitus: Secondary | ICD-10-CM | POA: Diagnosis not present

## 2019-08-13 DIAGNOSIS — Z79899 Other long term (current) drug therapy: Secondary | ICD-10-CM | POA: Insufficient documentation

## 2019-08-13 DIAGNOSIS — Z9049 Acquired absence of other specified parts of digestive tract: Secondary | ICD-10-CM | POA: Diagnosis not present

## 2019-08-13 DIAGNOSIS — E782 Mixed hyperlipidemia: Secondary | ICD-10-CM | POA: Diagnosis not present

## 2019-08-13 DIAGNOSIS — K5900 Constipation, unspecified: Secondary | ICD-10-CM | POA: Diagnosis not present

## 2019-08-13 DIAGNOSIS — Z8349 Family history of other endocrine, nutritional and metabolic diseases: Secondary | ICD-10-CM | POA: Insufficient documentation

## 2019-08-13 DIAGNOSIS — K449 Diaphragmatic hernia without obstruction or gangrene: Secondary | ICD-10-CM

## 2019-08-13 DIAGNOSIS — Z7982 Long term (current) use of aspirin: Secondary | ICD-10-CM | POA: Diagnosis not present

## 2019-08-13 DIAGNOSIS — K219 Gastro-esophageal reflux disease without esophagitis: Secondary | ICD-10-CM | POA: Diagnosis not present

## 2019-08-13 DIAGNOSIS — R1310 Dysphagia, unspecified: Secondary | ICD-10-CM | POA: Diagnosis not present

## 2019-08-13 HISTORY — PX: BIOPSY: SHX5522

## 2019-08-13 HISTORY — PX: MALONEY DILATION: SHX5535

## 2019-08-13 HISTORY — PX: ESOPHAGOGASTRODUODENOSCOPY: SHX5428

## 2019-08-13 SURGERY — EGD (ESOPHAGOGASTRODUODENOSCOPY)
Anesthesia: Moderate Sedation

## 2019-08-13 MED ORDER — ONDANSETRON HCL 4 MG/2ML IJ SOLN
INTRAMUSCULAR | Status: AC
  Filled 2019-08-13: qty 2

## 2019-08-13 MED ORDER — SODIUM CHLORIDE 0.9 % IV SOLN: INTRAVENOUS | Status: DC

## 2019-08-13 MED ORDER — MIDAZOLAM HCL 5 MG/5ML IJ SOLN
INTRAMUSCULAR | Status: DC | PRN
  Administered 2019-08-13: 2 mg via INTRAVENOUS
  Administered 2019-08-13 (×3): 1 mg via INTRAVENOUS

## 2019-08-13 MED ORDER — MIDAZOLAM HCL 5 MG/5ML IJ SOLN
INTRAMUSCULAR | Status: AC
  Filled 2019-08-13: qty 10

## 2019-08-13 MED ORDER — MEPERIDINE HCL 50 MG/ML IJ SOLN
INTRAMUSCULAR | Status: AC
  Filled 2019-08-13: qty 1

## 2019-08-13 MED ORDER — STERILE WATER FOR IRRIGATION IR SOLN
Status: DC | PRN
  Administered 2019-08-13: 100 mL

## 2019-08-13 MED ORDER — MEPERIDINE HCL 100 MG/ML IJ SOLN
INTRAMUSCULAR | Status: DC | PRN
  Administered 2019-08-13: 10 mg via INTRAVENOUS
  Administered 2019-08-13: 15 mg via INTRAVENOUS
  Administered 2019-08-13: 25 mg via INTRAVENOUS

## 2019-08-13 MED ORDER — LIDOCAINE VISCOUS HCL 2 % MT SOLN
OROMUCOSAL | Status: AC
  Filled 2019-08-13: qty 15

## 2019-08-13 MED ORDER — ONDANSETRON HCL 4 MG/2ML IJ SOLN
INTRAMUSCULAR | Status: DC | PRN
  Administered 2019-08-13: 4 mg via INTRAVENOUS

## 2019-08-13 NOTE — Discharge Instructions (Signed)
EGD Discharge instructions Please read the instructions outlined below and refer to this sheet in the next few weeks. These discharge instructions provide you with general information on caring for yourself after you leave the hospital. Your doctor may also give you specific instructions. While your treatment has been planned according to the most current medical practices available, unavoidable complications occasionally occur. If you have any problems or questions after discharge, please call your doctor. ACTIVITY  You may resume your regular activity but move at a slower pace for the next 24 hours.   Take frequent rest periods for the next 24 hours.   Walking will help expel (get rid of) the air and reduce the bloated feeling in your abdomen.   No driving for 24 hours (because of the anesthesia (medicine) used during the test).   You may shower.   Do not sign any important legal documents or operate any machinery for 24 hours (because of the anesthesia used during the test).  NUTRITION  Drink plenty of fluids.   You may resume your normal diet.   Begin with a light meal and progress to your normal diet.   Avoid alcoholic beverages for 24 hours or as instructed by your caregiver.  MEDICATIONS  You may resume your normal medications unless your caregiver tells you otherwise.  WHAT YOU CAN EXPECT TODAY  You may experience abdominal discomfort such as a feeling of fullness or gas pains.  FOLLOW-UP  Your doctor will discuss the results of your test with you.  SEEK IMMEDIATE MEDICAL ATTENTION IF ANY OF THE FOLLOWING OCCUR:  Excessive nausea (feeling sick to your stomach) and/or vomiting.   Severe abdominal pain and distention (swelling).   Trouble swallowing.   Temperature over 101 F (37.8 C).   Rectal bleeding or vomiting of blood.    Gastroesophageal Reflux Disease, Adult Gastroesophageal reflux (GER) happens when acid from the stomach flows up into the tube that  connects the mouth and the stomach (esophagus). Normally, food travels down the esophagus and stays in the stomach to be digested. With GER, food and stomach acid sometimes move back up into the esophagus. You may have a disease called gastroesophageal reflux disease (GERD) if the reflux:  Happens often.  Causes frequent or very bad symptoms.  Causes problems such as damage to the esophagus. When this happens, the esophagus becomes sore and swollen (inflamed). Over time, GERD can make small holes (ulcers) in the lining of the esophagus. What are the causes? This condition is caused by a problem with the muscle between the esophagus and the stomach. When this muscle is weak or not normal, it does not close properly to keep food and acid from coming back up from the stomach. The muscle can be weak because of:  Tobacco use.  Pregnancy.  Having a certain type of hernia (hiatal hernia).  Alcohol use.  Certain foods and drinks, such as coffee, chocolate, onions, and peppermint. What increases the risk? You are more likely to develop this condition if you:  Are overweight.  Have a disease that affects your connective tissue.  Use NSAID medicines. What are the signs or symptoms? Symptoms of this condition include:  Heartburn.  Difficult or painful swallowing.  The feeling of having a lump in the throat.  A bitter taste in the mouth.  Bad breath.  Having a lot of saliva.  Having an upset or bloated stomach.  Belching.  Chest pain. Different conditions can cause chest pain. Make sure you see  your doctor if you have chest pain.  Shortness of breath or noisy breathing (wheezing).  Ongoing (chronic) cough or a cough at night.  Wearing away of the surface of teeth (tooth enamel).  Weight loss. How is this treated? Treatment will depend on how bad your symptoms are. Your doctor may suggest:  Changes to your diet.  Medicine.  Surgery. Follow these instructions at  home: Eating and drinking   Follow a diet as told by your doctor. You may need to avoid foods and drinks such as: ? Coffee and tea (with or without caffeine). ? Drinks that contain alcohol. ? Energy drinks and sports drinks. ? Bubbly (carbonated) drinks or sodas. ? Chocolate and cocoa. ? Peppermint and mint flavorings. ? Garlic and onions. ? Horseradish. ? Spicy and acidic foods. These include peppers, chili powder, curry powder, vinegar, hot sauces, and BBQ sauce. ? Citrus fruit juices and citrus fruits, such as oranges, lemons, and limes. ? Tomato-based foods. These include red sauce, chili, salsa, and pizza with red sauce. ? Fried and fatty foods. These include donuts, french fries, potato chips, and high-fat dressings. ? High-fat meats. These include hot dogs, rib eye steak, sausage, ham, and bacon. ? High-fat dairy items, such as whole milk, butter, and cream cheese.  Eat small meals often. Avoid eating large meals.  Avoid drinking large amounts of liquid with your meals.  Avoid eating meals during the 2-3 hours before bedtime.  Avoid lying down right after you eat.  Do not exercise right after you eat. Lifestyle   Do not use any products that contain nicotine or tobacco. These include cigarettes, e-cigarettes, and chewing tobacco. If you need help quitting, ask your doctor.  Try to lower your stress. If you need help doing this, ask your doctor.  If you are overweight, lose an amount of weight that is healthy for you. Ask your doctor about a safe weight loss goal. General instructions  Pay attention to any changes in your symptoms.  Take over-the-counter and prescription medicines only as told by your doctor. Do not take aspirin, ibuprofen, or other NSAIDs unless your doctor says it is okay.  Wear loose clothes. Do not wear anything tight around your waist.  Raise (elevate) the head of your bed about 6 inches (15 cm).  Avoid bending over if this makes your  symptoms worse.  Keep all follow-up visits as told by your doctor. This is important. Contact a doctor if:  You have new symptoms.  You lose weight and you do not know why.  You have trouble swallowing or it hurts to swallow.  You have wheezing or a cough that keeps happening.  Your symptoms do not get better with treatment.  You have a hoarse voice. Get help right away if:  You have pain in your arms, neck, jaw, teeth, or back.  You feel sweaty, dizzy, or light-headed.  You have chest pain or shortness of breath.  You throw up (vomit) and your throw-up looks like blood or coffee grounds.  You pass out (faint).  Your poop (stool) is bloody or black.  You cannot swallow, drink, or eat. Summary  If a person has gastroesophageal reflux disease (GERD), food and stomach acid move back up into the esophagus and cause symptoms or problems such as damage to the esophagus.  Treatment will depend on how bad your symptoms are.  Follow a diet as told by your doctor.  Take all medicines only as told by your doctor. This information  is not intended to replace advice given to you by your health care provider. Make sure you discuss any questions you have with your health care provider. Document Revised: 11/21/2017 Document Reviewed: 11/21/2017 Elsevier Patient Education  2020 [place], Adult     A hernia happens when tissue inside your body pushes out through a weak spot in your belly muscles (abdominal wall). This makes a round lump (bulge). The lump may be:  In a scar from surgery that was done in your belly (incisional hernia).  Near your belly button (umbilical hernia).  In your groin (inguinal hernia). Your groin is the area where your leg meets your lower belly (abdomen). This kind of hernia could also be: ? In your scrotum, if you are female. ? In folds of skin around your vagina, if you are female.  In your upper thigh (femoral hernia).  Inside your  belly (hiatal hernia). This happens when your stomach slides above the muscle between your belly and your chest (diaphragm). If your hernia is small and it does not cause pain, you may not need treatment. If your hernia is large or it causes pain, you may need surgery. Follow these instructions at home: Activity  Avoid stretching or overusing (straining) the muscles near your hernia. Straining can happen when you: ? Lift something heavy. ? Poop (have a bowel movement).  Do not lift anything that is heavier than 10 lb (4.5 kg), or the limit that you are told, until your doctor says that it is safe.  Use the strength of your legs when you lift something heavy. Do not use only your back muscles to lift. General instructions  Do these things if told by your doctor so you do not have trouble pooping (constipation): ? Drink enough fluid to keep your pee (urine) pale yellow. ? Eat foods that are high in fiber. These include fresh fruits and vegetables, whole grains, and beans. ? Limit foods that are high in fat and processed sugars. These include foods that are fried or sweet. ? Take medicine for trouble pooping.  When you cough, try to cough gently.  You may try to push your hernia in by very gently pressing on it when you are lying down. Do not try to force the bulge back in if it will not push in easily.  If you are overweight, work with your doctor to lose weight safely.  Do not use any products that have nicotine or tobacco in them. These include cigarettes and e-cigarettes. If you need help quitting, ask your doctor.  If you will be having surgery (hernia repair), watch your hernia for changes in shape, size, or color. Tell your doctor if you see any changes.  Take over-the-counter and prescription medicines only as told by your doctor.  Keep all follow-up visits as told by your doctor. Contact a doctor if:  You get new pain, swelling, or redness near your hernia.  You poop fewer  times in a week than normal.  You have trouble pooping.  You have poop (stool) that is more dry than normal.  You have poop that is harder or larger than normal. Get help right away if:  You have a fever.  You have belly pain that gets worse.  You feel sick to your stomach (nauseous).  You throw up (vomit).  Your hernia cannot be pushed in by very gently pressing on it when you are lying down. Do not try to force the bulge back in  if it will not push in easily.  Your hernia: ? Changes in shape or size. ? Changes color. ? Feels hard or it hurts when you touch it. These symptoms may represent a serious problem that is an emergency. Do not wait to see if the symptoms will go away. Get medical help right away. Call your local emergency services (911 in the [place] Summary  A hernia happens when tissue inside your body pushes out through a weak spot in the belly muscles. This creates a bulge.  If your hernia is small and it does not hurt, you may not need treatment. If your hernia is large or it hurts, you may need surgery.  If you will be having surgery, watch your hernia for changes in shape, size, or color. Tell your doctor about any changes. This information is not intended to replace advice given to you by your health care provider. Make sure you discuss any questions you have with your health care provider. Document Revised: 09/05/2018 Document Reviewed: 02/14/2017 Elsevier Patient Education  [place].   GERD and hiatal hernia information provided  Increasing omeprazole to 40 mg twice daily (take 1 pill before breakfast and the other before supper) every day  1 small polyp in your stomach biopsied  Further recommendations to follow pending review of pathology report (by mail)  Office visit with [place] in 3 months

## 2019-08-13 NOTE — Interval H&P Note (Signed)
History and Physical Interval Note:  08/13/2019 2:28 PM  Yesenia Bishop  has presented today for surgery, with the diagnosis of GERD, vomiting, left upper quadrant pain, dysphagia.  The various methods of treatment have been discussed with the patient and family. After consideration of risks, benefits and other options for treatment, the patient has consented to  Procedure(s) with comments: ESOPHAGOGASTRODUODENOSCOPY (EGD) (N/A) - 2:45pm MALONEY DILATION (N/A) as a surgical intervention.  The patient's history has been reviewed, patient examined, no change in status, stable for surgery.  I have reviewed the patient's chart and labs.  Questions were answered to the patient's satisfaction.     Manus Rudd  Some improvement in symptoms with switch to omeprazole 20 mg daily.  EGD with esophageal dilation as feasible/appropriate today per plan.  The risks, benefits, limitations, alternatives and imponderables have been reviewed with the patient. Potential for esophageal dilation, biopsy, etc. have also been reviewed.  Questions have been answered. All parties agreeable.

## 2019-08-13 NOTE — Op Note (Signed)
[facility] Patient Name: Yesenia Bishop Procedure Date: 08/13/2019 2:09 PM MRN: [id number] Date of Birth: May 01, 1951 Attending MD: Norvel Richards , MD CSN: [id number] Age: 69 Admit Type: Outpatient Procedure:                Upper GI endoscopy Indications:              Dysphagia Providers:                Norvel Richards, MD, Rosina Lowenstein, RN, Randa Spike, Technician Referring MD:              Medicines:                Midazolam 5 mg IV, Meperidine 50 mg IV, Ondansetron                            4 mg IV Complications:            No immediate complications. Estimated Blood Loss:     Estimated blood loss was minimal. Procedure:                Pre-Anesthesia Assessment:                           - Prior to the procedure, a History and Physical                            was performed, and patient medications and                            allergies were reviewed. The patient's tolerance of                            previous anesthesia was also reviewed. The risks                            and benefits of the procedure and the sedation                            options and risks were discussed with the patient.                            All questions were answered, and informed consent                            was obtained. Prior Anticoagulants: The patient has                            taken no previous anticoagulant or antiplatelet                            agents. ASA Grade Assessment: II - A patient with  mild systemic disease. After reviewing the risks                            and benefits, the patient was deemed in                            satisfactory condition to undergo the procedure.                           After obtaining informed consent, the endoscope was                            passed under direct vision. Throughout the                            procedure, the patient's blood pressure,  pulse, and                            oxygen saturations were monitored continuously. The                            [id number]-[id number]) scope was introduced through the                            mouth, and advanced to the second part of duodenum.                            The upper GI endoscopy was accomplished without                            difficulty. The patient tolerated the procedure                            well. Scope In: 2:38:32 PM Scope Out: 2:46:21 PM Total Procedure Duration: 0 hours 7 minutes 49 seconds  Findings:      The examined esophagus was normal.      A large hiatal hernia was present(GE junction 32 cm, diaphragmatic       hiatus 39 cm from incisors). 5 mm polyp in the gastric cardia.      The exam was otherwise without abnormality.      The duodenal bulb and second portion of the duodenum were normal. The       scope was withdrawn. Dilation was performed with a Maloney dilator with       mild resistance at 44 Fr. The dilation site was examined following       endoscope reinsertion and showed no change. Estimated blood loss was       minimal. Finally, gastric polyp was biopsied with a cold forceps for       histology. Estimated blood loss was minimal. Impression:               - Normal esophagus. Dilated.                           - Large hiatal hernia. Biopsied. Gastric polyp  biopsied                           - The examination was otherwise normal.                           - Normal duodenal bulb and second portion of the                            duodenum. Moderate Sedation:      Moderate (conscious) sedation was administered by the endoscopy nurse       and supervised by the endoscopist. The following parameters were       monitored: oxygen saturation, heart rate, blood pressure, respiratory       rate, EKG, adequacy of pulmonary ventilation, and response to care.       Total physician intraservice time was 16  minutes. Recommendation:           - Patient has a contact number available for                            emergencies. The signs and symptoms of potential                            delayed complications were discussed with the                            patient. Return to normal activities tomorrow.                            Written discharge instructions were provided to the                            patient.                           - Resume previous diet. Stop omeprazole 20 mg                            daily. Begin omeprazole 40 mg twice daily (before                            breakfast and supper). Follow-up on pathology.                           - Continue present medications.                           - Await pathology results.                           - Return to my office in 3 months. Procedure Code(s):        --- Professional ---                           [phone], Esophagogastroduodenoscopy, flexible,  transoral; with biopsy, single or multiple                           43450, Dilation of esophagus, by unguided sound or                            bougie, single or multiple passes                           G0500, Moderate sedation services provided by the                            same physician or other qualified health care                            professional performing a gastrointestinal                            endoscopic service that sedation supports,                            requiring the presence of an independent trained                            observer to assist in the monitoring of the                            patient's level of consciousness and physiological                            status; initial 15 minutes of intra-service time;                            patient age 76 years or older (additional time may                            be reported with [phone], as appropriate) Diagnosis Code(s):        --- Professional  ---                           K44.9, Diaphragmatic hernia without obstruction or                            gangrene                           R13.10, Dysphagia, unspecified CPT copyright 2019 American Medical Association. All rights reserved. The codes documented in this report are preliminary and upon coder review may  be revised to meet current compliance requirements. Cristopher Estimable. Mickie Badders, MD Norvel Richards, MD 08/13/2019 2:58:28 PM This report has been signed electronically. Number of Addenda: 0

## 2019-08-14 ENCOUNTER — Telehealth: Payer: Self-pay | Admitting: Gastroenterology

## 2019-08-14 ENCOUNTER — Encounter: Payer: Self-pay | Admitting: Gastroenterology

## 2019-08-14 NOTE — Telephone Encounter (Signed)
Please let pt know that we received copy of last TCS report done in 2012.  Colonoscopy incomplete, scope could not be advanced all the way around.   She should have another attempt at colonoscopy. We can discuss at ov in June unless she wants to pursue sooner.   Let's see if we can get copy of barium enema (xray) from [facility] done around 2012/2013.

## 2019-08-15 ENCOUNTER — Encounter: Payer: Self-pay | Admitting: Internal Medicine

## 2019-08-15 ENCOUNTER — Other Ambulatory Visit: Payer: Self-pay

## 2019-08-15 LAB — SURGICAL PATHOLOGY

## 2019-08-15 NOTE — Telephone Encounter (Signed)
Noted. Spoke with pt. Pt is aware that her TCS records are reviewed. Pt is ok with waiting until her apt in June to discuss TCS.

## 2019-08-15 NOTE — Telephone Encounter (Signed)
Requested.

## 2019-08-25 ENCOUNTER — Telehealth: Payer: Self-pay

## 2019-08-25 NOTE — Telephone Encounter (Signed)
Received a VM. Pt asked for RMR's nurse to call her back. Called pt at [phone] and someone from [place] answered the phone. Pt left her phone there and they are leaving it with customer service. I left a message for pts daughter, so pt can go back and get her phone. Pt can contact me back when she gets her phone back.

## 2019-08-26 ENCOUNTER — Telehealth: Payer: Self-pay | Admitting: Internal Medicine

## 2019-08-26 NOTE — Telephone Encounter (Signed)
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   As far as her reflux, can she be having acid reflux into her esophagus causing symptoms without mucosa damage. Her reflux is likely due to large hiatal hernia. Continue omeprazole BID before meals. Keep follow up ov.

## 2019-08-26 NOTE — Telephone Encounter (Signed)
Noted. Spoke with pt. Pt is aware of reflux information given by [facility]. Pt will continue Omeprazole bid as directed and keep f/u.

## 2019-08-26 NOTE — Telephone Encounter (Signed)
Spoke with pt. Pt would like to know what makes polyps grow in you stomach. Pt states she has heard of polyps being in the colon. Pt received her letter from RMR and is aware that the polyp removed was benign. Pt also wants to know what is causing her reflux if her esophagus is normal. Pt is taking Omeprazole bid as directed at her discharge. Please advise in the absence of RMR

## 2019-08-26 NOTE — Telephone Encounter (Signed)
Pt was calling to speak with RMR or nurse. Please call [phone]

## 2019-09-11 ENCOUNTER — Encounter: Payer: Self-pay | Admitting: Cardiology

## 2019-09-11 ENCOUNTER — Encounter: Payer: Self-pay | Admitting: *Deleted

## 2019-09-11 ENCOUNTER — Telehealth (INDEPENDENT_AMBULATORY_CARE_PROVIDER_SITE_OTHER): Payer: Medicare Other | Admitting: Cardiology

## 2019-09-11 VITALS — Ht 67.0 in | Wt 200.0 lb

## 2019-09-11 DIAGNOSIS — E782 Mixed hyperlipidemia: Secondary | ICD-10-CM | POA: Diagnosis not present

## 2019-09-11 DIAGNOSIS — R0789 Other chest pain: Secondary | ICD-10-CM | POA: Diagnosis not present

## 2019-09-11 DIAGNOSIS — I10 Essential (primary) hypertension: Secondary | ICD-10-CM | POA: Diagnosis not present

## 2019-09-11 DIAGNOSIS — R600 Localized edema: Secondary | ICD-10-CM

## 2019-09-11 NOTE — Patient Instructions (Signed)
Your physician wants you to follow-up in: [place] will receive a reminder letter in the mail two months in advance. If you don't receive a letter, please call our office to schedule the follow-up appointment.  Your physician recommends that you continue on your current medications as directed. Please refer to the Current Medication list given to you today.  PLEASE CALL us BACK WITH HEART RATE AND BLOOD PRESSURE  Thank you for choosing [facility]!!

## 2019-09-11 NOTE — Progress Notes (Signed)
Virtual Visit via Telephone Note   This visit type was conducted due to national recommendations for restrictions regarding the COVID-19 Pandemic (e.g. social distancing) in an effort to limit this patient's exposure and mitigate transmission in our community.  Due to her co-morbid illnesses, this patient is at least at moderate risk for complications without adequate follow up.  This format is felt to be most appropriate for this patient at this time.  The patient did not have access to video technology/had technical difficulties with video requiring transitioning to audio format only (telephone).  All issues noted in this document were discussed and addressed.  No physical exam could be performed with this format.  Please refer to the patient's chart for her  consent to telehealth for [facility].   The patient was identified using 2 identifiers.  Date:  09/11/2019   ID:  Yesenia Bishop, DOB 09/03/1950, MRN [id number]  Patient Location: Home Provider Location: Office  PCP:  Monico Blitz, MD  Cardiologist:  Carlyle Dolly, MD  Electrophysiologist:  None   Evaluation Performed:  Follow-Up Visit  Chief Complaint:  Follow up  History of Present Illness:    Yesenia Bishop is a 69 y.o. female seen today as a new patient for the following medical problems   1. Chest pain - admission to [facility] to Dr Irven Shelling service for chest pain 08/2018 - managed for NSTEMI, peak trop 0.18. EKG deep TWIs V1-V5 - 08/2018 cath only luminal irregularlities, signs of vasospasm. Managed medically - 08/2018 echo LVEF no WMAs, LV poorly visualized, no LVEF reported. I independently reviewed study and LVEF is 60-65%, no WMAs    - no recent chest pani. No SOB/DOE - she is compliant with meds   2. HTN - compliant with meds  3. Hyperlipidemia -labs followed by pcp, she is on statin  4. Chronic leg edema - no recent issues - she is no longer taking lasix prn, she is not sure why.       Has had both covid vaccines.     The patient does not have symptoms concerning for COVID-19 infection (fever, chills, cough, or new shortness of breath).    Past Medical History:  Diagnosis Date  . HTN (hypertension)   . Mixed hyperlipidemia 08/28/2018   Past Surgical History:  Procedure Laterality Date  . 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 APPENDECTOMY    . BIOPSY  08/13/2019   Procedure: BIOPSY;  Surgeon: Daneil Dolin, MD;  [facility];  Service: Endoscopy;;  . CHOLECYSTECTOMY    . COLONOSCOPY  12/2010   Dr. Sherrlyn Hock: Excellent..  Unable to navigate proximal to the hepatic flexure.  Otherwise visualized colon was normal.  Consider propofol, exam following up with barium enema.  . ESOPHAGOGASTRODUODENOSCOPY N/A 08/13/2019   Procedure: ESOPHAGOGASTRODUODENOSCOPY (EGD);  Surgeon: Daneil Dolin, MD;  [facility];  Service: Endoscopy;  Laterality: N/A;  2:45pm  . LEFT HEART CATH AND CORONARY ANGIOGRAPHY N/A 08/28/2018   Procedure: LEFT HEART CATH AND CORONARY ANGIOGRAPHY;  Surgeon: Adrian Prows, MD;  Location: [place] [facility];  Service: Cardiovascular;  Laterality: N/A;  . Venia Minks DILATION N/A 08/13/2019   Procedure: Venia Minks DILATION;  Surgeon: Daneil Dolin, MD;  [facility];  Service: Endoscopy;  Laterality: N/A;  . SHOULDER SURGERY Right   . TUBAL LIGATION       No outpatient medications have been marked as taking for the  09/11/19 encounter (Appointment) with Arnoldo Lenis, MD.     Allergies:   Patient has no known allergies.   Social History   Tobacco Use  . Smoking status: Never Smoker  . Smokeless tobacco: Never Used  Substance Use Topics  . Alcohol use: Yes    Alcohol/week: 1.0 standard drinks    Types: 1 Glasses of wine per week    Comment: socially  . Drug use: No     Family Hx: The patient's family history includes Diabetes in her father; Heart disease in her father;  Hyperlipidemia in her father; Hypertension in her brother and father; Varicose Veins in her father.  ROS:   Please see the history of present illness.     All other systems reviewed and are negative.   Prior CV studies:   The following studies were reviewed today:    Labs/Other Tests and Data Reviewed:    EKG:  No ECG reviewed.  Recent Labs: No results found for requested labs within last 8760 hours.   Recent Lipid Panel Lab Results  Component Value Date/Time   CHOL 216 (H) 08/27/2018 08:24 PM   TRIG 74 08/27/2018 08:24 PM   HDL 85 08/27/2018 08:24 PM   CHOLHDL 2.5 08/27/2018 08:24 PM   LDLCALC 116 (H) 08/27/2018 08:24 PM    Wt Readings from Last 3 Encounters:  08/13/19 200 lb (90.7 kg)  07/28/19 206 lb 9.6 oz (93.7 kg)  01/27/19 200 lb (90.7 kg)     Objective:    Vital Signs:   Today's Vitals   09/11/19 0938  Weight: 200 lb (90.7 kg)  Height: 5\' 7"  (1.702 m)   Body mass index is 31.32 kg/m. Normal affect. Normal speech pattern and tone. Comfortable, no apparent distress. No audible signs of sob or wheezing.   ASSESSMENT & PLAN:     1. Chest pain - doing well without symptoms, continue current meds  2. LE edema - no recent issues, appears to be off prn lasix. Not clear if she just stopped taking or if was stopped by another provider. Withotu swelling no indication to restart  3. HTN - she will call this afternoon with home bp, Was elevated at last GI appt  4. Hyperlipidemia - continue statin, request pcp labs   COVID-19 Education: The signs and symptoms of COVID-19 were discussed with the patient and how to seek care for testing (follow up with PCP or arrange E-visit).  The importance of social distancing was discussed today.  Time:   Today, I have spent 21 minutes with the patient with telehealth technology discussing the above problems.     Medication Adjustments/Labs and Tests Ordered: Current medicines are reviewed at length with the  patient today.  Concerns regarding medicines are outlined above.   Tests Ordered: No orders of the defined types were placed in this encounter.   Medication Changes: No orders of the defined types were placed in this encounter.   Follow Up:  In Person in 1 year(s)  Signed, Carlyle Dolly, MD  09/11/2019 8:41 AM    [place]

## 2019-11-19 ENCOUNTER — Other Ambulatory Visit: Payer: Self-pay

## 2019-11-19 ENCOUNTER — Encounter: Payer: Self-pay | Admitting: Gastroenterology

## 2019-11-19 ENCOUNTER — Ambulatory Visit (INDEPENDENT_AMBULATORY_CARE_PROVIDER_SITE_OTHER): Payer: Medicare Other | Admitting: Gastroenterology

## 2019-11-19 VITALS — BP 155/91 | HR 62 | Temp 97.0°F | Ht 67.0 in | Wt 207.0 lb

## 2019-11-19 DIAGNOSIS — K219 Gastro-esophageal reflux disease without esophagitis: Secondary | ICD-10-CM

## 2019-11-19 DIAGNOSIS — K5900 Constipation, unspecified: Secondary | ICD-10-CM | POA: Diagnosis not present

## 2019-11-19 DIAGNOSIS — R198 Other specified symptoms and signs involving the digestive system and abdomen: Secondary | ICD-10-CM

## 2019-11-19 DIAGNOSIS — R1012 Left upper quadrant pain: Secondary | ICD-10-CM | POA: Diagnosis not present

## 2019-11-19 DIAGNOSIS — K449 Diaphragmatic hernia without obstruction or gangrene: Secondary | ICD-10-CM | POA: Diagnosis not present

## 2019-11-19 NOTE — Patient Instructions (Signed)
No PA needed for CT abd/pelvis per [facility] website.

## 2019-11-19 NOTE — Patient Instructions (Signed)
1. Continue omeprazole 40mg  twice daily before meals.  2. You can continue prune juice and probiotics/yogurt as before for bowels. If stools continue to alternate between constipation and diarrhea, then you could consider adding Fiberchoice chew two daily.  3. We will plan for labs and CT scan to evaluate your abdominal pain. 4. We will get you scheduled for colonoscopy in the next couple of months, once schedule is available.  5. Call with any questions or concerns.

## 2019-11-19 NOTE — Progress Notes (Signed)
Primary Care Physician: Monico Blitz, MD  Primary Gastroenterologist:  Garfield Cornea, MD   Chief Complaint  Patient presents with  . Abdominal Pain    left upper abd, comes/goes, tender at times  . diarrhea/constipation    alternates    HPI: Yesenia Bishop is a 69 y.o. female here for follow-up.  She was seen back in March 2021.  She has complaints of nocturnal regurgitation, dysphagia to solids and liquids, intermittent left upper quadrant pain unrelated to meals occurring for the past year.  Also with lots of constipation, bowel movement only twice per week with use of MiraLAX every other day and prune juice.  We received a copy of her colonoscopy from Dr. Truddie Hidden then in 2012.  Colonoscopy was incomplete, scope could not be advanced all the way around due to sharp angle at splenic flexure and redundant colon.  Apparently was scheduled follow-up barium enema but we never received a copy of the record and patient does not recall having this done.  After the last office visit we started her on Amitiza 24 mcg once to twice daily with food for constipation.  She states she really did not feel like she needed anything.  Currently not taking Amitiza.  She was switched to omeprazole 20 mg daily.  Previously had been on pantoprazole without relief.  Completed EGD March 2021 which showed normal-appearing esophagus was empirically dilated for history of dysphagia.  She had a large hiatal hernia.  Gastric polyp removed was hyperplastic.  Omeprazole was increased to 40 mg twice daily.  At this time she complains of alternating constipation and diarrhea. Each 50% of the time.  Can go several days without a bowel movement.  Takes prune juice and probiotic yogurt to help.  No melena, brbpr. Abdominal cramping before BMs, better after bowel movement. A lot of gas at times.  Continues to have left upper quadrant pain.  Not necessarily related to meals.  Notes it particularly in the evenings when she  tries to lay down to rest.  Cannot lay on that side.  Has been going on for over a year.  No melena or rectal bleeding.  Her reflux is much better controlled on omeprazole 40 mg twice daily.  Dysphagia improved as well.  No nocturnal symptoms but if she bends over she has regurgitation.   Wt Readings from Last 3 Encounters:  11/19/19 207 lb (93.9 kg)  09/11/19 200 lb (90.7 kg)  08/13/19 200 lb (90.7 kg)     Current Outpatient Medications  Medication Sig Dispense Refill  . amLODipine-olmesartan (AZOR) 10-40 MG per tablet Take 1 tablet by mouth daily.    Marland Kitchen aspirin 81 MG tablet Take 81 mg by mouth at bedtime.     Marland Kitchen atorvastatin (LIPITOR) 10 MG tablet Take 10 mg by mouth at bedtime.     . Carboxymethylcellul-Glycerin (LUBRICATING EYE DROPS OP) Place 1 drop into both eyes daily as needed (dry eyes).    . carvedilol (COREG) 6.25 MG tablet Take 6.25 mg by mouth daily.    . cetirizine (ZYRTEC) 10 MG tablet Take 10 mg by mouth daily as needed for allergies.    . famotidine (PEPCID) 20 MG tablet Take 20 mg by mouth at bedtime as needed for heartburn or indigestion.    . nitroGLYCERIN (NITROSTAT) 0.4 MG SL tablet Place 1 tablet (0.4 mg total) under the tongue every 5 (five) minutes x 3 doses as needed for chest pain. 25 tablet 1  .  omeprazole (PRILOSEC) 40 MG capsule Take 40 mg by mouth 2 (two) times daily before a meal.    . OVER THE COUNTER MEDICATION Take 1 tablet by mouth daily. BP optimizer otc supplement    . oxymetazoline (AFRIN) 0.05 % nasal spray Place 1 spray into both nostrils 2 (two) times daily as needed for congestion.     No current facility-administered medications for this visit.    Allergies as of 11/19/2019  . (No Known Allergies)   Past Medical History:  Diagnosis Date  . GERD (gastroesophageal reflux disease)   . HTN (hypertension)   . Mixed hyperlipidemia 08/28/2018   Past Surgical History:  Procedure Laterality Date  . ABDOMINAL SURGERY     remote: "intestines not  configured right"  . APPENDECTOMY    . BIOPSY  08/13/2019   Procedure: BIOPSY;  Surgeon: Daneil Dolin, MD;  [facility];  Service: Endoscopy;;  . CHOLECYSTECTOMY    . COLONOSCOPY  12/2010   Dr. Sherrlyn Hock: Excellent..  Unable to navigate proximal to the hepatic flexure.  Otherwise visualized colon was normal.  Consider propofol, exam following up with barium enema.  . ESOPHAGOGASTRODUODENOSCOPY N/A 08/13/2019   Rourk: Esophagus normal, status post dilation for history of dysphagia.  Large hiatal hernia.  Gastric polyps removed, hyperplastic.  Marland Kitchen LEFT HEART CATH AND CORONARY ANGIOGRAPHY N/A 08/28/2018   Procedure: LEFT HEART CATH AND CORONARY ANGIOGRAPHY;  Surgeon: Adrian Prows, MD;  Location: [place] [facility];  Service: Cardiovascular;  Laterality: N/A;  . Venia Minks DILATION N/A 08/13/2019   Procedure: Venia Minks DILATION;  Surgeon: Daneil Dolin, MD;  [facility];  Service: Endoscopy;  Laterality: N/A;  . SHOULDER SURGERY Right   . TUBAL LIGATION     Family History  Problem Relation Age of Onset  . Diabetes Father   . Heart disease Father   . Hyperlipidemia Father   . Hypertension Father   . Varicose Veins Father   . Hypertension Brother    Social History   Tobacco Use  . Smoking status: Never Smoker  . Smokeless tobacco: Never Used  Vaping Use  . Vaping Use: Never used  Substance Use Topics  . Alcohol use: Yes    Alcohol/week: 1.0 standard drink    Types: 1 Glasses of wine per week    Comment: socially  . Drug use: No    ROS:  General: Negative for anorexia, weight loss, fever, chills, fatigue, weakness. ENT: Negative for hoarseness, difficulty swallowing , nasal congestion. CV: Negative for chest pain, angina, palpitations, dyspnea on exertion, positive chronic peripheral edema.  Respiratory: Negative for dyspnea at rest, dyspnea on exertion, cough, sputum, wheezing.  GI: See history of present illness. GU:  Negative for dysuria, hematuria,  urinary incontinence, urinary frequency, nocturnal urination.  Endo: Negative for unusual weight change.    Physical Examination:   BP (!) 155/91   Pulse 62   Temp (!) 97 F (36.1 C) (Oral)   Ht 5\' 7"  (1.702 m)   Wt 207 lb (93.9 kg)   BMI 32.42 kg/m   General: Well-nourished, well-developed in no acute distress.  Eyes: No icterus. Mouth: masked Lungs: Clear to auscultation bilaterally.  Heart: Regular rate and rhythm, no murmurs rubs or gallops.  Abdomen: Bowel sounds are normal, nondistended, no hepatosplenomegaly or masses, no abdominal bruits or hernia , no rebound or guarding.  Moderate left upper quadrant tenderness Extremities: 2+ pedal edema bilaterally. No clubbing or deformities. Neuro: Alert and oriented x 4  Skin: Warm and dry, no jaundice.   Psych: Alert and cooperative, normal mood and affect.    Impression/plan:  Pleasant 69 year old female with history of GERD, large hiatal hernia, 1 year history of intermittent left upper quadrant pain, change in bowel habits presenting for follow-up.  Overall her reflux is much better controlled on current regimen of omeprazole 40 mg twice daily.  We will continue current dose at this time but ultimately would like to get her back down to once daily dosing.  We discussed large hiatal hernia, associated symptoms, and the possibility of requiring surgery in the future if she has refractory symptoms to medication.  We will continue to monitor for now.  Change in bowel habits with alternating constipation and diarrhea.  Previously was predominantly constipated.  Continue prune juice, probiotics/yogurt, add Fiberchoice 2 daily.  Incomplete colonoscopy in 2012.  Would recommend updating colonoscopy at this time.  Plan for deep sedation given history of incomplete colonoscopy and difficult exam.  May benefit from examination with pediatric scope if appropriate.  I have discussed the risks, alternatives, benefits with regards to but not  limited to the risk of reaction to medication, bleeding, infection, perforation and the patient is agreeable to proceed. Written consent to be obtained.  Chronic intermittent left upper quadrant pain for 1 year duration.  May be secondary to large hiatal hernia need to consider other etiologies.  Plan for CT abdomen pelvis with contrast to further evaluate abdominal pain and change in bowel habits.  Rule out underlying malignancy, pancreatic issues, diverticulitis, colon stricture.  Obtain labs.

## 2019-11-20 LAB — CBC WITH DIFFERENTIAL/PLATELET
Absolute Monocytes: 386 cells/uL (ref 200–950)
Basophils Absolute: 48 cells/uL (ref 0–200)
Basophils Relative: 0.7 %
Eosinophils Absolute: 110 cells/uL (ref 15–500)
Eosinophils Relative: 1.6 %
HCT: 34.4 % — ABNORMAL LOW (ref 35.0–45.0)
Hemoglobin: 10.6 g/dL — ABNORMAL LOW (ref 11.7–15.5)
Lymphs Abs: 1835 cells/uL (ref 850–3900)
MCH: 23.7 pg — ABNORMAL LOW (ref 27.0–33.0)
MCHC: 30.8 g/dL — ABNORMAL LOW (ref 32.0–36.0)
MCV: 77 fL — ABNORMAL LOW (ref 80.0–100.0)
MPV: 10.7 fL (ref 7.5–12.5)
Monocytes Relative: 5.6 %
Neutro Abs: 4520 cells/uL (ref 1500–7800)
Neutrophils Relative %: 65.5 %
Platelets: 305 10*3/uL (ref 140–400)
RBC: 4.47 10*6/uL (ref 3.80–5.10)
RDW: 17.9 % — ABNORMAL HIGH (ref 11.0–15.0)
Total Lymphocyte: 26.6 %
WBC: 6.9 10*3/uL (ref 3.8–10.8)

## 2019-11-20 LAB — COMPREHENSIVE METABOLIC PANEL
AG Ratio: 1.2 (calc) (ref 1.0–2.5)
ALT: 13 U/L (ref 6–29)
AST: 18 U/L (ref 10–35)
Albumin: 4.5 g/dL (ref 3.6–5.1)
Alkaline phosphatase (APISO): 106 U/L (ref 37–153)
BUN: 13 mg/dL (ref 7–25)
CO2: 27 mmol/L (ref 20–32)
Calcium: 10.2 mg/dL (ref 8.6–10.4)
Chloride: 102 mmol/L (ref 98–110)
Creat: 0.87 mg/dL (ref 0.50–0.99)
Globulin: 3.8 g/dL (calc) — ABNORMAL HIGH (ref 1.9–3.7)
Glucose, Bld: 83 mg/dL (ref 65–139)
Potassium: 3.8 mmol/L (ref 3.5–5.3)
Sodium: 140 mmol/L (ref 135–146)
Total Bilirubin: 0.5 mg/dL (ref 0.2–1.2)
Total Protein: 8.3 g/dL — ABNORMAL HIGH (ref 6.1–8.1)

## 2019-11-20 LAB — LIPASE: Lipase: 16 U/L (ref 7–60)

## 2019-11-20 NOTE — Progress Notes (Signed)
Cc'ed to pcp °

## 2019-12-03 ENCOUNTER — Telehealth: Payer: Self-pay | Admitting: *Deleted

## 2019-12-03 NOTE — Telephone Encounter (Signed)
LMOVM to schedule TCS with propofol with [facility]

## 2019-12-10 ENCOUNTER — Ambulatory Visit (HOSPITAL_COMMUNITY): Payer: Medicare Other

## 2019-12-12 ENCOUNTER — Other Ambulatory Visit: Payer: Self-pay

## 2019-12-12 ENCOUNTER — Ambulatory Visit (HOSPITAL_COMMUNITY)
Admission: RE | Admit: 2019-12-12 | Discharge: 2019-12-12 | Disposition: A | Discharge status: Home / Self Care | Payer: Medicare Other | Source: Ambulatory Visit | Attending: Gastroenterology | Admitting: Gastroenterology

## 2019-12-12 DIAGNOSIS — K5900 Constipation, unspecified: Secondary | ICD-10-CM | POA: Diagnosis present

## 2019-12-12 DIAGNOSIS — K449 Diaphragmatic hernia without obstruction or gangrene: Secondary | ICD-10-CM

## 2019-12-12 DIAGNOSIS — R1012 Left upper quadrant pain: Secondary | ICD-10-CM | POA: Diagnosis present

## 2019-12-12 DIAGNOSIS — R198 Other specified symptoms and signs involving the digestive system and abdomen: Secondary | ICD-10-CM

## 2019-12-12 DIAGNOSIS — K219 Gastro-esophageal reflux disease without esophagitis: Secondary | ICD-10-CM | POA: Diagnosis present

## 2019-12-12 IMAGING — CT CT ABD-PELV W/ CM
2 of 5 series · 16 of 46 positions shown, 18 images · IV contrast (Omnipaque or Isovue)
Comparison: None.

CLINICAL DATA: Left-sided abdominal pain with change in bowel
habits for 1 month.

EXAM:
CT ABDOMEN AND PELVIS WITH CONTRAST
TECHNIQUE: Multidetector CT imaging of the abdomen and pelvis was performed
using the standard protocol following bolus administration of
intravenous contrast.
CONTRAST:  100mL OMNIPAQUE IOHEXOL 300 MG/ML  SOLN

[Series 2: axial st · axial · 0.88mm/px · z∈[+1011,+1396]mm · 13 of 89 slices shown, 15 images]
[im 6/89  soft-tissue]
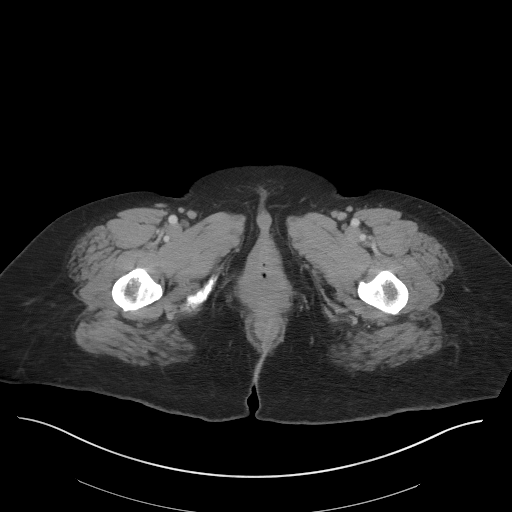
[im 6/89  bone]
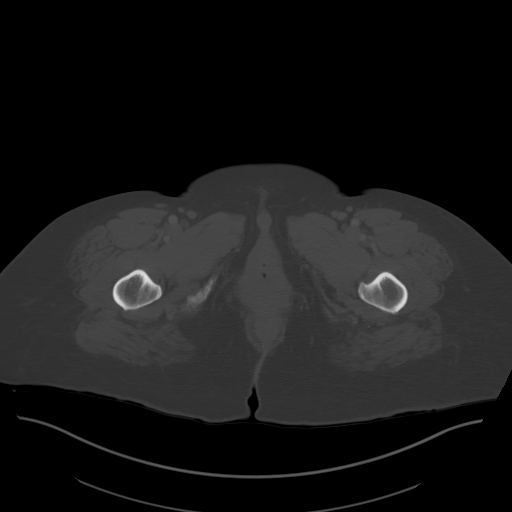
[im 11/89  soft-tissue]
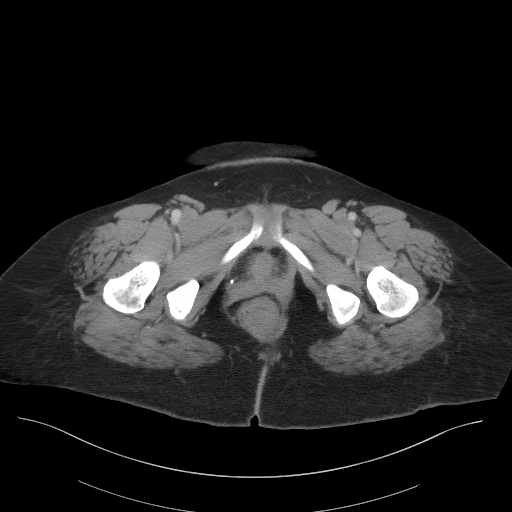
[im 21/89  soft-tissue]
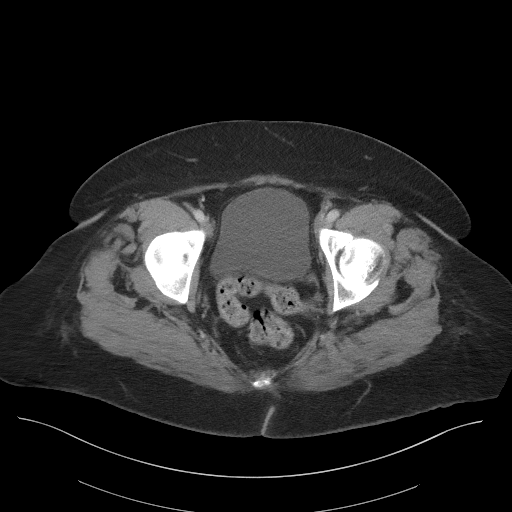
[im 26/89  soft-tissue]
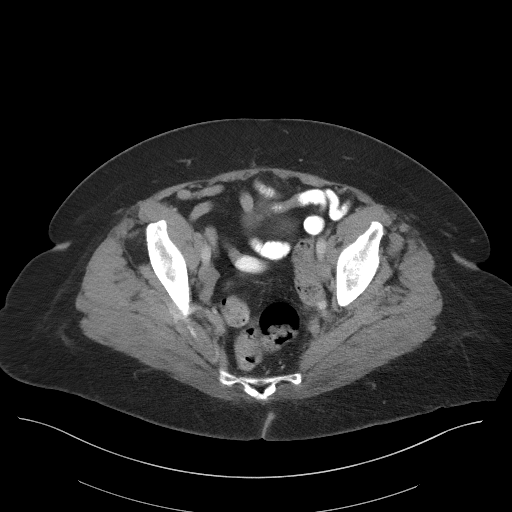
[im 32/89  soft-tissue]
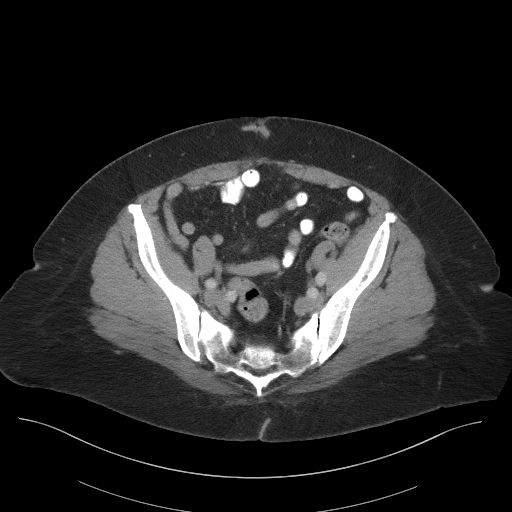
[im 37/89  soft-tissue]
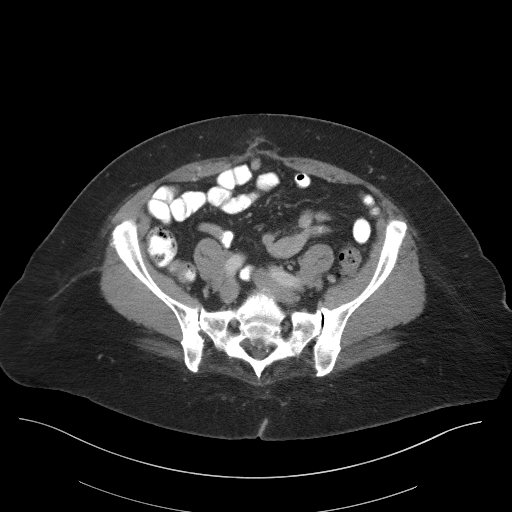
[im 47/89  soft-tissue]
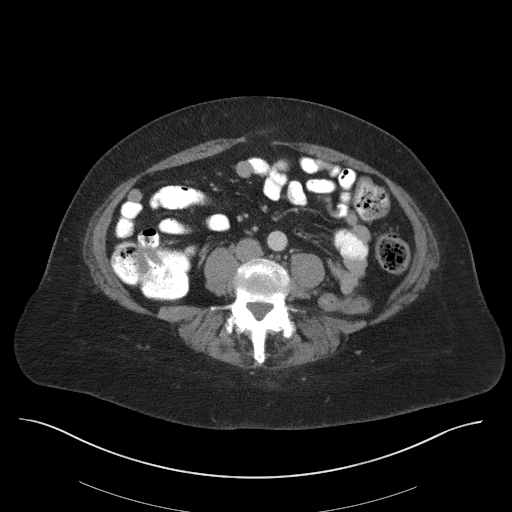
[im 52/89  soft-tissue]
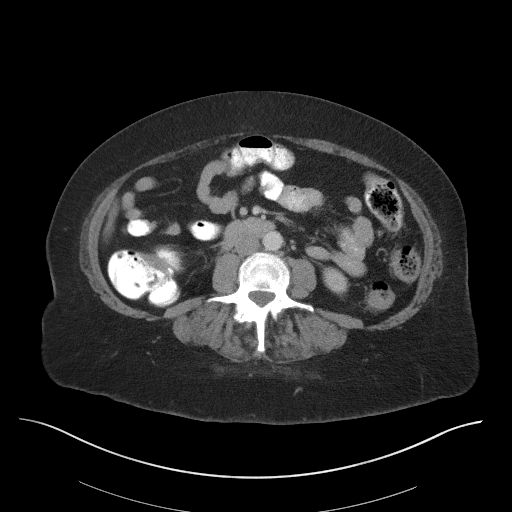
[im 57/89  soft-tissue]
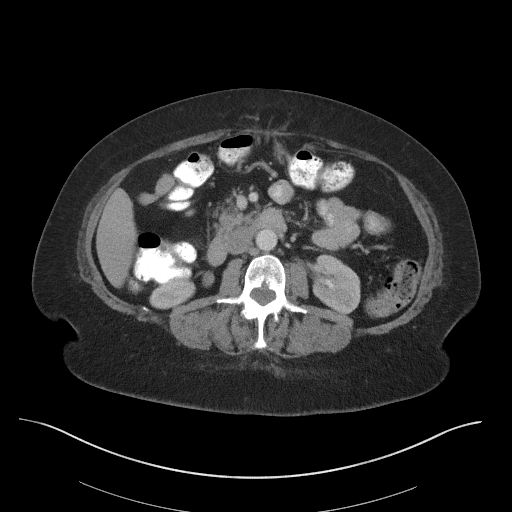
[im 57/89  bone]
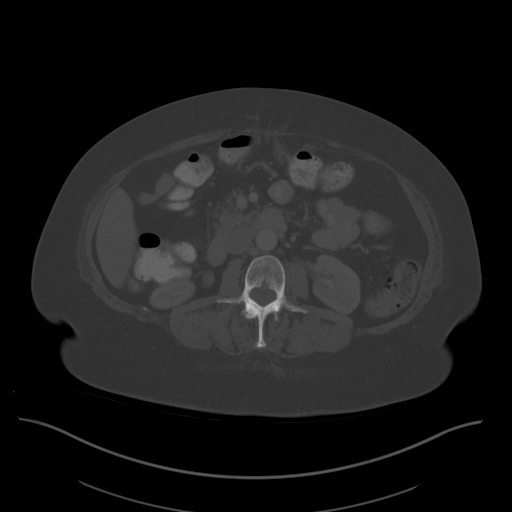
[im 63/89  soft-tissue]
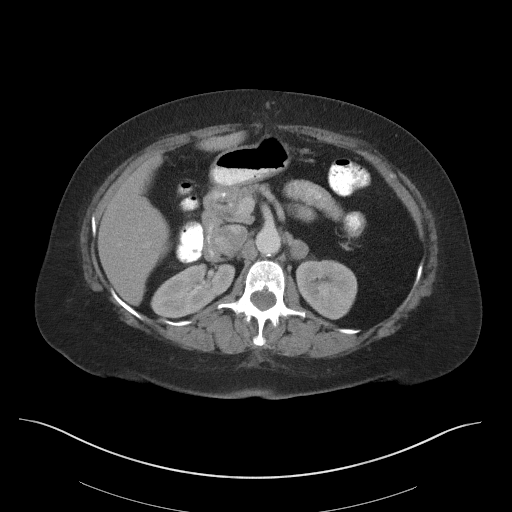
[im 68/89  soft-tissue]
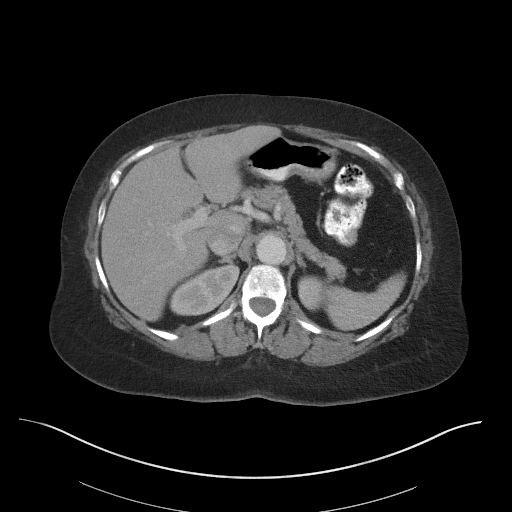
[im 78/89  soft-tissue]
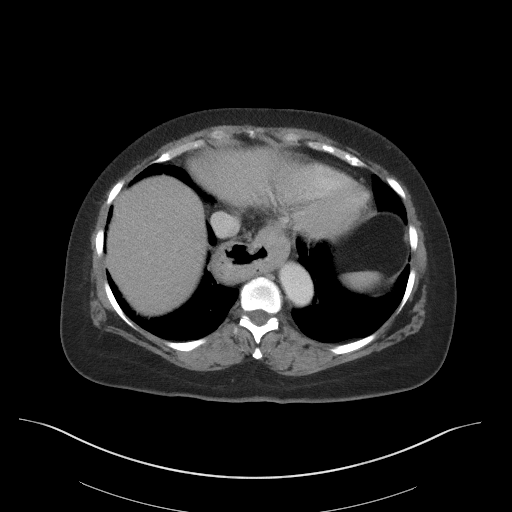
[im 83/89  soft-tissue]
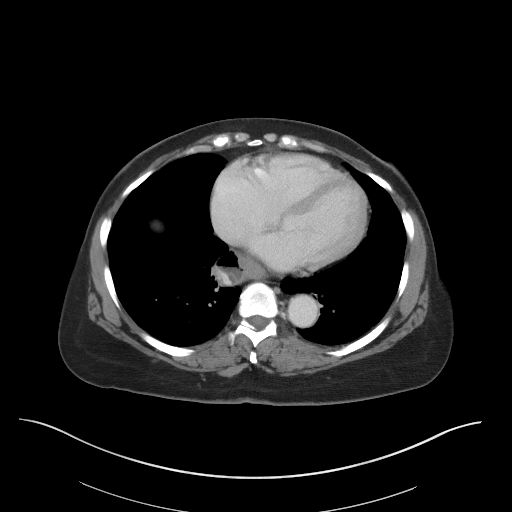

[Series 5: coronal st · coronal · 0.87mm/px · 3 of 109 slices shown]
[im 37/109  soft-tissue]
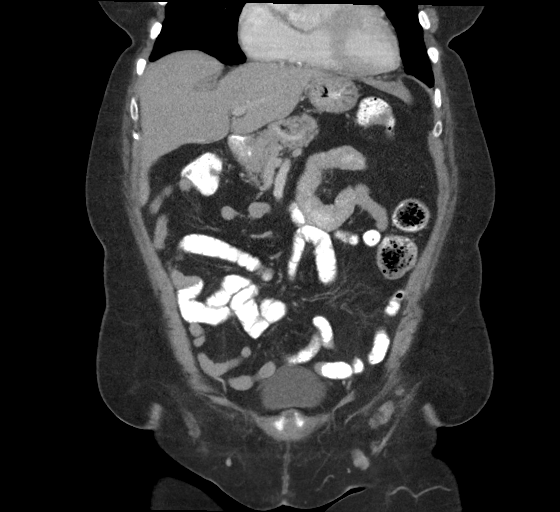
[im 49/109  soft-tissue]
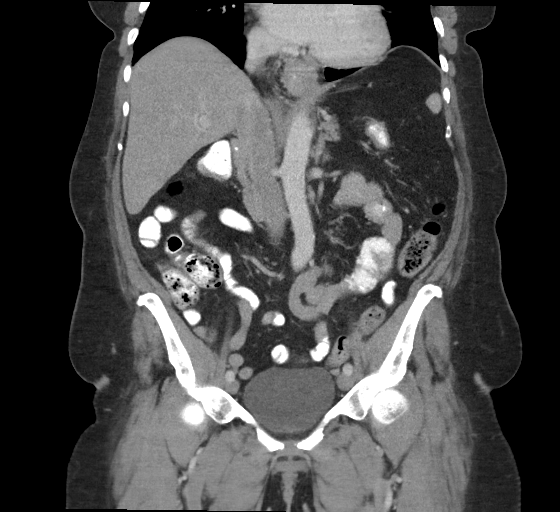
[im 61/109  soft-tissue]
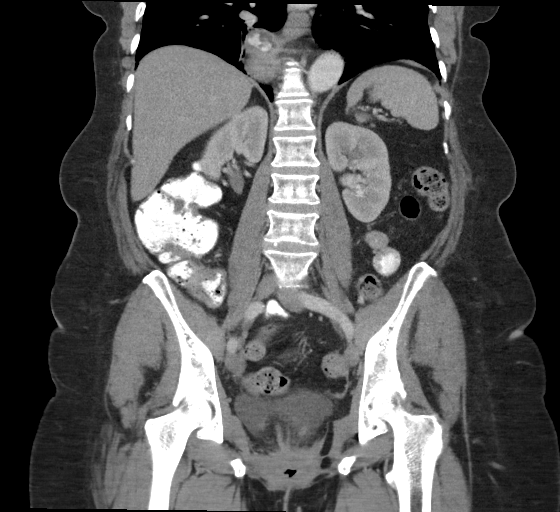

[16 of 46 positions shown; findings below may reference images not displayed]

FINDINGS: Lower Chest: No acute findings.

Hepatobiliary: No hepatic masses identified. Mild diffuse hepatic
steatosis is noted. Prior cholecystectomy. No evidence of biliary
obstruction.

Pancreas:  No mass or inflammatory changes.

Spleen: Within normal limits in size and appearance.

Adrenals/Urinary Tract: 1.7 cm homogeneous left adrenal mass is
seen, which has nonspecific characteristics. No renal masses
identified. No evidence of ureteral calculi or hydronephrosis.
Unremarkable unopacified urinary bladder.

Stomach/Bowel: Moderate size hiatal hernia is seen. No evidence of
obstruction, inflammatory process or abnormal fluid collections.

Vascular/Lymphatic: No pathologically enlarged lymph nodes. No
abdominal aortic aneurysm.

Reproductive: Prior hysterectomy noted. Adnexal regions are
unremarkable in appearance.

Other: 3 small midline epigastric ventral hernias are seen, all of
which contain only fat. No evidence of herniated bowel loops.

Musculoskeletal:  No suspicious bone lesions identified.
IMPRESSION: No acute findings.

Moderate hiatal hernia.

Three small epigastric ventral hernias, all of which contain only
fat.

Mild hepatic steatosis.

1.7 cm indeterminate left adrenal mass. If patient has no history of
cancer, this is most likely a benign adenoma, and followup by CT is
recommended in 12 months. If patient does have history of cancer,
further evaluation is recommended at this time with adrenal protocol
abdomen CT without and with contrast. This recommendation follows
ACR consensus guidelines: Management of Incidental Adrenal Masses: A
White Paper of the ACR Incidental Findings Committee. [HOSPITAL] [VZ];14:[PHONE_NUMBER].

## 2019-12-12 MED ORDER — IOHEXOL 300 MG/ML  SOLN
100.0000 mL | Freq: Once | INTRAMUSCULAR | Status: AC | PRN
  Administered 2019-12-12: 100 mL via INTRAVENOUS

## 2019-12-28 DIAGNOSIS — I639 Cerebral infarction, unspecified: Secondary | ICD-10-CM

## 2019-12-28 HISTORY — DX: Cerebral infarction, unspecified: I63.9

## 2020-01-02 NOTE — Telephone Encounter (Signed)
Letter mailed

## 2020-01-08 ENCOUNTER — Telehealth: Payer: Self-pay | Admitting: Gastroenterology

## 2020-01-08 NOTE — Telephone Encounter (Signed)
Patient was called x 2 and letter mailed 8/6 to call back to schedule

## 2020-01-08 NOTE — Telephone Encounter (Signed)
Thank you for the info.

## 2020-01-08 NOTE — Telephone Encounter (Signed)
Is patient still on backlog to be scheduled for colonoscopy with [facility]?

## 2020-01-15 ENCOUNTER — Telehealth: Payer: Self-pay | Admitting: Internal Medicine

## 2020-01-15 NOTE — Telephone Encounter (Signed)
Pt received letter that we were unable to reach via phone to schedule her colonoscopy. [phone]

## 2020-01-15 NOTE — Telephone Encounter (Signed)
Called pt and she is scheduled for TCS with propofol with Dr. Gala Romney for 10/14 at 10:30am. Patient aware needs covid test prior. Advised will mail prep instructions with this appt. Confirmed mailing address.

## 2020-01-26 ENCOUNTER — Inpatient Hospital Stay (HOSPITAL_COMMUNITY)
Admission: RE | Admit: 2020-01-26 | Discharge: 2020-01-26 | Disposition: A | Discharge status: Home / Self Care | Payer: Medicare Other | Source: Ambulatory Visit | Attending: Nurse Practitioner | Admitting: Nurse Practitioner

## 2020-01-26 ENCOUNTER — Other Ambulatory Visit: Payer: Self-pay

## 2020-01-26 ENCOUNTER — Other Ambulatory Visit: Payer: Self-pay | Admitting: Nurse Practitioner

## 2020-01-26 DIAGNOSIS — R4781 Slurred speech: Secondary | ICD-10-CM

## 2020-01-26 DIAGNOSIS — I619 Nontraumatic intracerebral hemorrhage, unspecified: Secondary | ICD-10-CM | POA: Diagnosis not present

## 2020-01-26 DIAGNOSIS — I610 Nontraumatic intracerebral hemorrhage in hemisphere, subcortical: Secondary | ICD-10-CM | POA: Diagnosis not present

## 2020-01-26 IMAGING — MR MR HEAD W/O CM
11 of 12 series · 41 of 48 positions shown · non-contrast
Comparison: No pertinent prior exams are available for comparison.
COMPARISON: No pertinent prior exams are available for comparison.

Addendum:
CLINICAL DATA: Slurred speech. Additional history provided:
Left-sided weakness and slurred speech for 2 days.

EXAM:
MRI HEAD WITHOUT CONTRAST
TECHNIQUE: Multiplanar, multiecho pulse sequences of the brain and surrounding
structures were obtained without intravenous contrast.

[Series 5: DWI · axial · 4.0mm · 0.88mm/px · z∈[-90,+58]mm · 4 of 38 slices shown (1 of 6)]
[im 1/38]
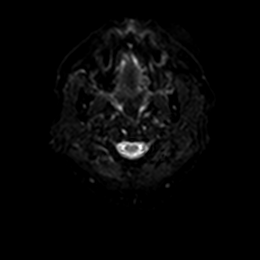
[im 13/38]
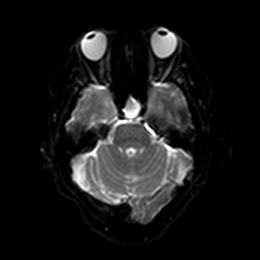
[im 25/38]
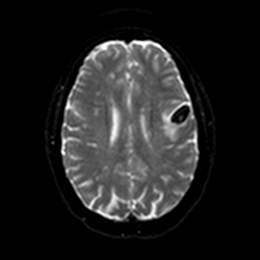
[im 38/38]
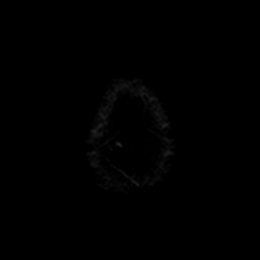

[Series 5: DWI · axial · 4.0mm · 0.88mm/px · z∈[-90,+58]mm · 5 of 38 slices shown (2 of 6)]
[im 1/38]
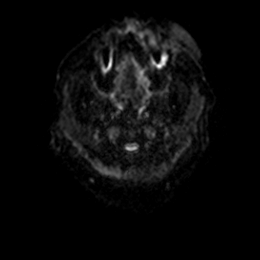
[im 10/38]
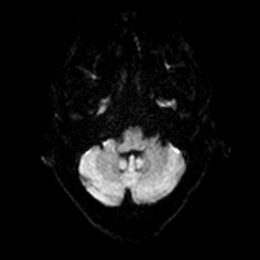
[im 19/38]
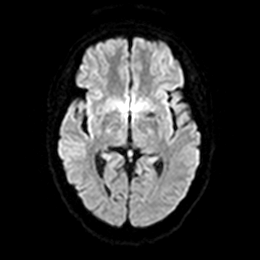
[im 28/38]
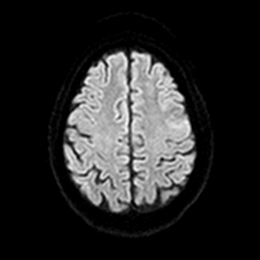
[im 38/38]
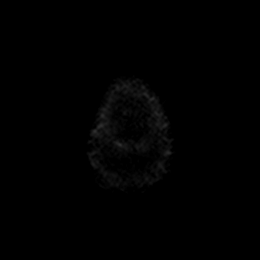

[Series 6: DWI · axial · 4.0mm · 0.88mm/px · z∈[-90,+58]mm · 5 of 38 slices shown (3 of 6)]
[im 1/38]
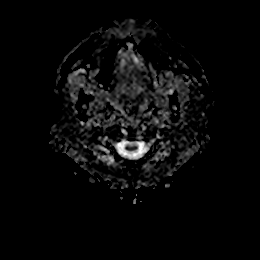
[im 10/38]
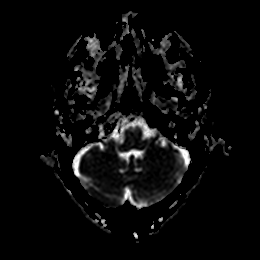
[im 19/38]
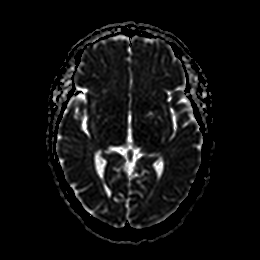
[im 28/38]
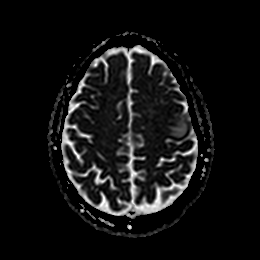
[im 38/38]
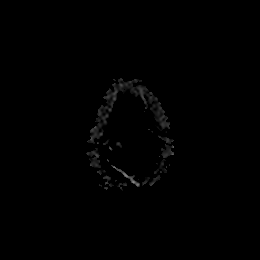

[Series 7: DWI · coronal · 4.0mm · 0.88mm/px · 4 of 32 slices shown (4 of 6)]
[im 1/32]
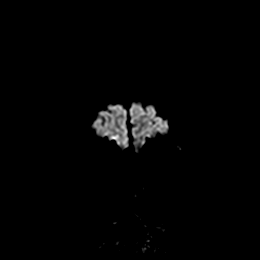
[im 11/32]
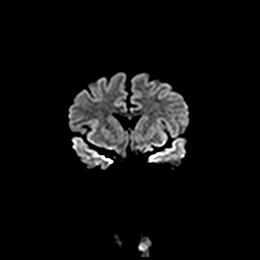
[im 21/32]
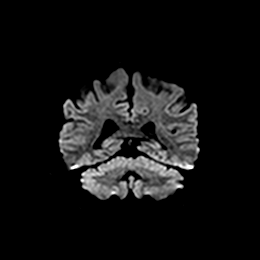
[im 32/32]
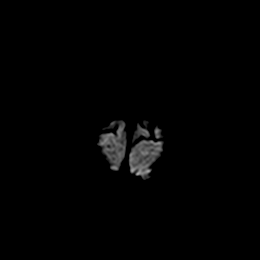

[Series 7: DWI · coronal · 4.0mm · 0.88mm/px · 4 of 32 slices shown (5 of 6)]
[im 1/32]
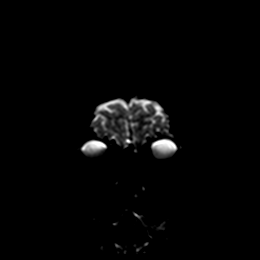
[im 11/32]
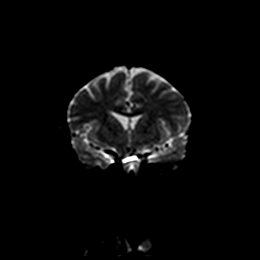
[im 21/32]
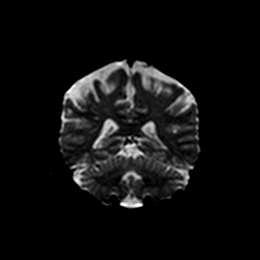
[im 32/32]
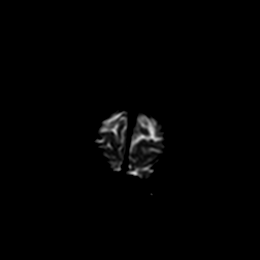

[Series 8: DWI · coronal · 4.0mm · 0.88mm/px · 4 of 32 slices shown (6 of 6)]
[im 1/32]
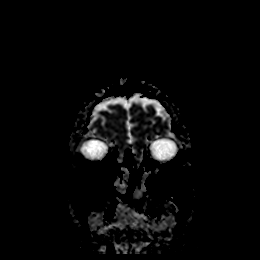
[im 11/32]
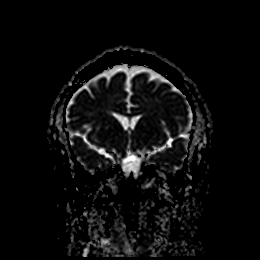
[im 21/32]
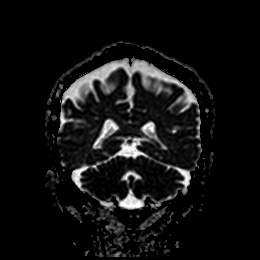
[im 32/32]
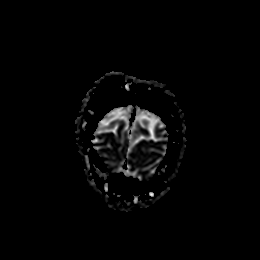

[Series 9: T1 · sagittal · 5.0mm · 0.94mm/px · 3 of 25 slices shown]
[im 1/25]
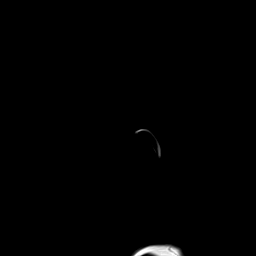
[im 13/25]
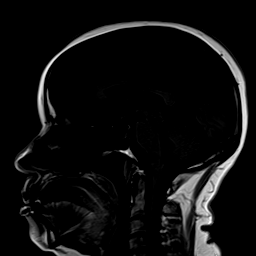
[im 25/25]
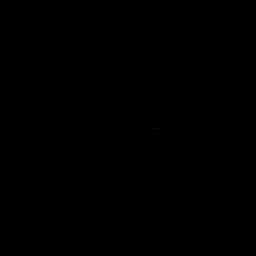

[Series 10: T2 · axial · 5.0mm · 0.72mm/px · z∈[-75,+57]mm · 2 of 20 slices shown (1 of 2)]
[im 1/20]
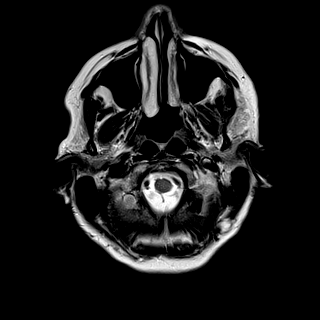
[im 20/20]
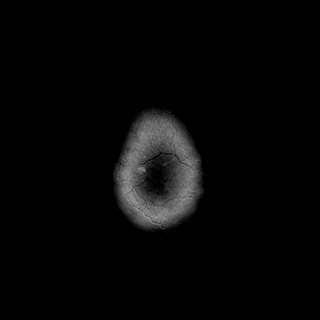

[Series 11: ax hemo · axial · 5.0mm · 0.86mm/px · z∈[-82,+62]mm · 3 of 25 slices shown]
[im 1/25]
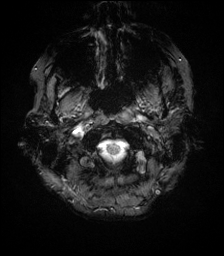
[im 13/25]
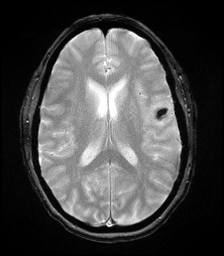
[im 25/25]
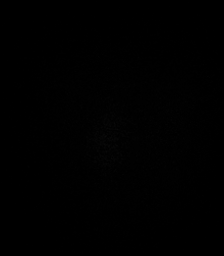

[Series 12: FLAIR · axial · 4.0mm · 0.43mm/px · z∈[-71,+60]mm · 4 of 34 slices shown]
[im 1/34]
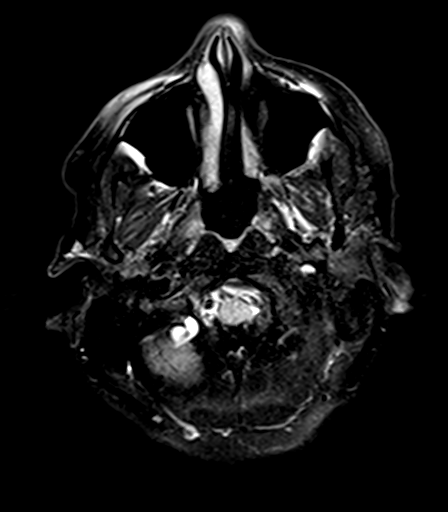
[im 12/34]
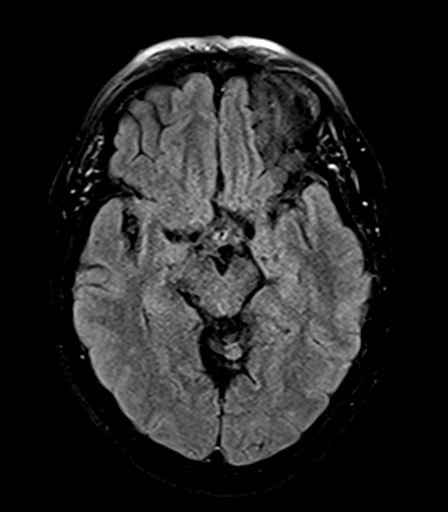
[im 23/34]
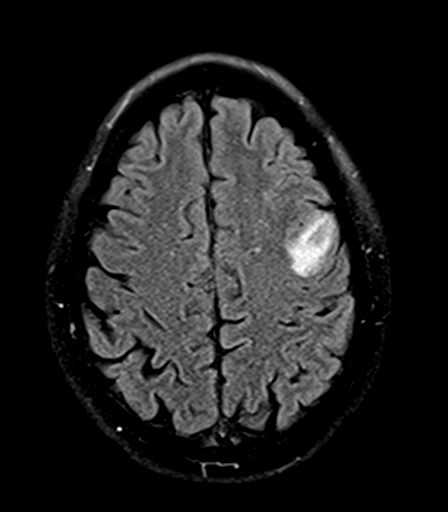
[im 34/34]
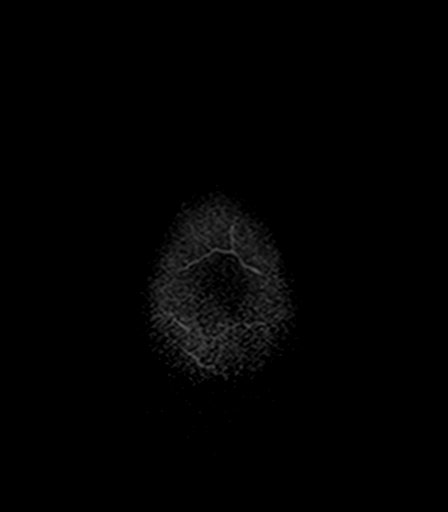

[Series 14: T2 · coronal · 5.0mm · 0.72mm/px · 3 of 28 slices shown (2 of 2)]
[im 1/28]
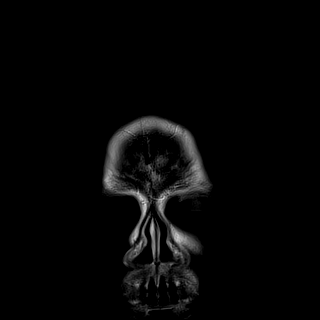
[im 14/28]
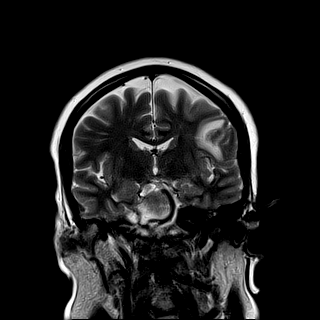
[im 28/28]
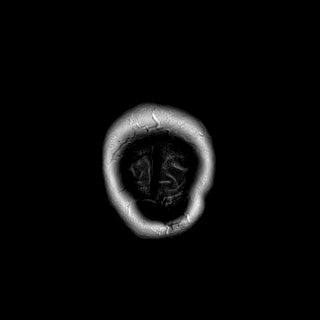

[41 of 48 positions shown; findings below may reference images not displayed]

FINDINGS: Brain:

There is a 2.2 cm hemorrhagic focus within the left frontal
operculum which appears centered within the subcortical white matter
(for instance as seen on series 10, image 13 and series 13, image
38). There is corresponding SWI signal loss at this site. There is a
small to moderate amount of surrounding edema. Susceptibility
artifact from blood products at this site limits evaluation for
associated restricted diffusion.

Mild background scattered T2/FLAIR hyperintensity within the
cerebral white matter is nonspecific, but likely reflect sequela of
chronic small vessel ischemic disease.

No extra-axial fluid collection.

No midline shift.

Partially empty sella turcica.

Vascular: Expected proximal arterial flow voids.

Skull and upper cervical spine: No focal marrow lesion.

Sinuses/Orbits: Visualized orbits show no acute finding. Trace
ethmoid sinus mucosal thickening. No significant mastoid effusion.
IMPRESSION: 2.2 cm hemorrhagic lesion centered within the subcortical white
matter of the left frontal operculum with surrounding small to
moderate edema. Differential considerations include a parenchymal
hemorrhage with associated edema, primary or metastatic hemorrhagic
neoplasm or hemorrhagic conversion of a recent white matter infarct,
among others. Noncontrast head CT is recommended to assess for any
acute component of hemorrhage. Additionally, contrast-enhanced brain
MRI is recommended for further characterization.

Background mild multifocal T2 hyperintensity within the cerebral
white matter which is nonspecific, but likely reflects chronic small
vessel ischemic disease.

Trace ethmoid sinus mucosal thickening.

ADDENDUM:
These results were called by telephone at the time of interpretation
on [DATE] at [DATE] to provider Dr. ANZALDUA , who verbally
acknowledged these results.

*** End of Addendum ***
FINDINGS: Brain:

There is a 2.2 cm hemorrhagic focus within the left frontal
operculum which appears centered within the subcortical white matter
(for instance as seen on series 10, image 13 and series 13, image
38). There is corresponding SWI signal loss at this site. There is a
small to moderate amount of surrounding edema. Susceptibility
artifact from blood products at this site limits evaluation for
associated restricted diffusion.

Mild background scattered T2/FLAIR hyperintensity within the
cerebral white matter is nonspecific, but likely reflect sequela of
chronic small vessel ischemic disease.

No extra-axial fluid collection.

No midline shift.

Partially empty sella turcica.

Vascular: Expected proximal arterial flow voids.

Skull and upper cervical spine: No focal marrow lesion.

Sinuses/Orbits: Visualized orbits show no acute finding. Trace
ethmoid sinus mucosal thickening. No significant mastoid effusion.
IMPRESSION: 2.2 cm hemorrhagic lesion centered within the subcortical white
matter of the left frontal operculum with surrounding small to
moderate edema. Differential considerations include a parenchymal
hemorrhage with associated edema, primary or metastatic hemorrhagic
neoplasm or hemorrhagic conversion of a recent white matter infarct,
among others. Noncontrast head CT is recommended to assess for any
acute component of hemorrhage. Additionally, contrast-enhanced brain
MRI is recommended for further characterization.

Background mild multifocal T2 hyperintensity within the cerebral
white matter which is nonspecific, but likely reflects chronic small
vessel ischemic disease.

Trace ethmoid sinus mucosal thickening.

## 2020-01-27 ENCOUNTER — Inpatient Hospital Stay (HOSPITAL_COMMUNITY): Payer: Medicare Other

## 2020-01-27 ENCOUNTER — Inpatient Hospital Stay (HOSPITAL_COMMUNITY)
Admission: EM | Admit: 2020-01-27 | Discharge: 2020-01-28 | DRG: 064 | Disposition: A | Discharge status: Home / Self Care | Payer: Medicare Other | Attending: Neurology | Admitting: Neurology

## 2020-01-27 ENCOUNTER — Other Ambulatory Visit: Payer: Self-pay

## 2020-01-27 ENCOUNTER — Encounter (HOSPITAL_COMMUNITY): Payer: Self-pay | Admitting: *Deleted

## 2020-01-27 ENCOUNTER — Emergency Department (HOSPITAL_COMMUNITY): Payer: Medicare Other

## 2020-01-27 DIAGNOSIS — G936 Cerebral edema: Secondary | ICD-10-CM | POA: Diagnosis present

## 2020-01-27 DIAGNOSIS — R29704 NIHSS score 4: Secondary | ICD-10-CM | POA: Diagnosis present

## 2020-01-27 DIAGNOSIS — N1831 Chronic kidney disease, stage 3a: Secondary | ICD-10-CM | POA: Diagnosis present

## 2020-01-27 DIAGNOSIS — Z6836 Body mass index (BMI) 36.0-36.9, adult: Secondary | ICD-10-CM | POA: Diagnosis not present

## 2020-01-27 DIAGNOSIS — N183 Chronic kidney disease, stage 3 unspecified: Secondary | ICD-10-CM | POA: Diagnosis present

## 2020-01-27 DIAGNOSIS — R29810 Facial weakness: Secondary | ICD-10-CM | POA: Diagnosis present

## 2020-01-27 DIAGNOSIS — I252 Old myocardial infarction: Secondary | ICD-10-CM

## 2020-01-27 DIAGNOSIS — I129 Hypertensive chronic kidney disease with stage 1 through stage 4 chronic kidney disease, or unspecified chronic kidney disease: Secondary | ICD-10-CM | POA: Diagnosis present

## 2020-01-27 DIAGNOSIS — Z833 Family history of diabetes mellitus: Secondary | ICD-10-CM

## 2020-01-27 DIAGNOSIS — R001 Bradycardia, unspecified: Secondary | ICD-10-CM | POA: Diagnosis present

## 2020-01-27 DIAGNOSIS — R471 Dysarthria and anarthria: Secondary | ICD-10-CM | POA: Diagnosis present

## 2020-01-27 DIAGNOSIS — I6389 Other cerebral infarction: Secondary | ICD-10-CM | POA: Diagnosis not present

## 2020-01-27 DIAGNOSIS — Z83438 Family history of other disorder of lipoprotein metabolism and other lipidemia: Secondary | ICD-10-CM | POA: Diagnosis not present

## 2020-01-27 DIAGNOSIS — I619 Nontraumatic intracerebral hemorrhage, unspecified: Secondary | ICD-10-CM | POA: Diagnosis present

## 2020-01-27 DIAGNOSIS — Z20822 Contact with and (suspected) exposure to covid-19: Secondary | ICD-10-CM | POA: Diagnosis present

## 2020-01-27 DIAGNOSIS — K219 Gastro-esophageal reflux disease without esophagitis: Secondary | ICD-10-CM | POA: Diagnosis present

## 2020-01-27 DIAGNOSIS — E669 Obesity, unspecified: Secondary | ICD-10-CM | POA: Diagnosis present

## 2020-01-27 DIAGNOSIS — Z8249 Family history of ischemic heart disease and other diseases of the circulatory system: Secondary | ICD-10-CM

## 2020-01-27 DIAGNOSIS — I610 Nontraumatic intracerebral hemorrhage in hemisphere, subcortical: Principal | ICD-10-CM | POA: Diagnosis present

## 2020-01-27 DIAGNOSIS — I10 Essential (primary) hypertension: Secondary | ICD-10-CM | POA: Diagnosis present

## 2020-01-27 DIAGNOSIS — E782 Mixed hyperlipidemia: Secondary | ICD-10-CM | POA: Diagnosis present

## 2020-01-27 LAB — DIFFERENTIAL
Abs Immature Granulocytes: 0.02 10*3/uL (ref 0.00–0.07)
Basophils Absolute: 0.1 10*3/uL (ref 0.0–0.1)
Basophils Relative: 1 %
Eosinophils Absolute: 0.1 10*3/uL (ref 0.0–0.5)
Eosinophils Relative: 2 %
Immature Granulocytes: 0 %
Lymphocytes Relative: 26 %
Lymphs Abs: 2.2 10*3/uL (ref 0.7–4.0)
Monocytes Absolute: 0.7 10*3/uL (ref 0.1–1.0)
Monocytes Relative: 8 %
Neutro Abs: 5.3 10*3/uL (ref 1.7–7.7)
Neutrophils Relative %: 63 %

## 2020-01-27 LAB — COMPREHENSIVE METABOLIC PANEL
ALT: 16 U/L (ref 0–44)
AST: 25 U/L (ref 15–41)
Albumin: 3.9 g/dL (ref 3.5–5.0)
Alkaline Phosphatase: 76 U/L (ref 38–126)
Anion gap: 12 (ref 5–15)
BUN: 15 mg/dL (ref 8–23)
CO2: 24 mmol/L (ref 22–32)
Calcium: 10.1 mg/dL (ref 8.9–10.3)
Chloride: 103 mmol/L (ref 98–111)
Creatinine, Ser: 1.18 mg/dL — ABNORMAL HIGH (ref 0.44–1.00)
GFR calc Af Amer: 55 mL/min — ABNORMAL LOW (ref >60)
GFR calc non Af Amer: 47 mL/min — ABNORMAL LOW (ref >60)
Glucose, Bld: 123 mg/dL — ABNORMAL HIGH (ref 70–99)
Potassium: 3.7 mmol/L (ref 3.5–5.1)
Sodium: 139 mmol/L (ref 135–145)
Total Bilirubin: <0.1 mg/dL — ABNORMAL LOW (ref 0.3–1.2)
Total Protein: 7.8 g/dL (ref 6.5–8.1)

## 2020-01-27 LAB — HEMOGLOBIN A1C
Hgb A1c MFr Bld: 6.2 % — ABNORMAL HIGH (ref 4.8–5.6)
Mean Plasma Glucose: 131.24 mg/dL

## 2020-01-27 LAB — CBC
HCT: 34.3 % — ABNORMAL LOW (ref 36.0–46.0)
Hemoglobin: 10.5 g/dL — ABNORMAL LOW (ref 12.0–15.0)
MCH: 24.6 pg — ABNORMAL LOW (ref 26.0–34.0)
MCHC: 30.6 g/dL (ref 30.0–36.0)
MCV: 80.3 fL (ref 80.0–100.0)
Platelets: 226 10*3/uL (ref 150–400)
RBC: 4.27 MIL/uL (ref 3.87–5.11)
RDW: 18.5 % — ABNORMAL HIGH (ref 11.5–15.5)
WBC: 8.4 10*3/uL (ref 4.0–10.5)
nRBC: 0 % (ref 0.0–0.2)

## 2020-01-27 LAB — ECHOCARDIOGRAM COMPLETE
Area-P 1/2: 3.42 cm2
Calc EF: 65.1 %
Height: 64 in
S' Lateral: 2.9 cm
Single Plane A2C EF: 64 %
Single Plane A4C EF: 67.5 %
Weight: 3360 oz

## 2020-01-27 LAB — HIV ANTIBODY (ROUTINE TESTING W REFLEX): HIV Screen 4th Generation wRfx: NONREACTIVE

## 2020-01-27 LAB — PROTIME-INR
INR: 0.9 (ref 0.8–1.2)
INR: 1 (ref 0.8–1.2)
Prothrombin Time: 12 seconds (ref 11.4–15.2)
Prothrombin Time: 12.7 seconds (ref 11.4–15.2)

## 2020-01-27 LAB — SARS CORONAVIRUS 2 BY RT PCR (HOSPITAL ORDER, PERFORMED IN ~~LOC~~ HOSPITAL LAB): SARS Coronavirus 2: NEGATIVE

## 2020-01-27 LAB — APTT: aPTT: 28 seconds (ref 24–36)

## 2020-01-27 IMAGING — MR MR HEAD W/ CM
3 of 5 series · 19 of 48 positions shown · IV contrast (Contrast agent)
Comparison: Noncontrast brain MRI [DATE]. Noncontrast head CT
[DATE].

CLINICAL DATA: Parenchymal hemorrhage, follow-up.

EXAM:
MRI HEAD WITH CONTRAST
TECHNIQUE: Multiplanar, multiecho pulse sequences of the brain and surrounding
structures were obtained with intravenous contrast.
CONTRAST:  8mL GADAVIST GADOBUTROL 1 MMOL/ML IV SOLN

[Series 6: T2 post-contrast · coronal · 5.0mm · 0.72mm/px · 7 of 28 slices shown]
[im 1/28]
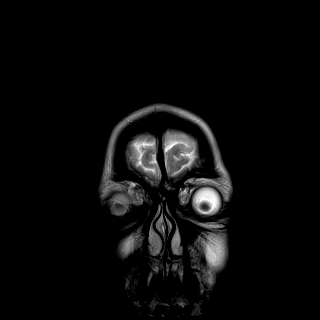
[im 5/28]
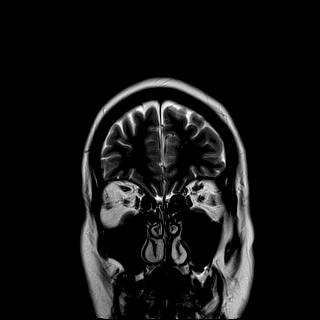
[im 10/28]
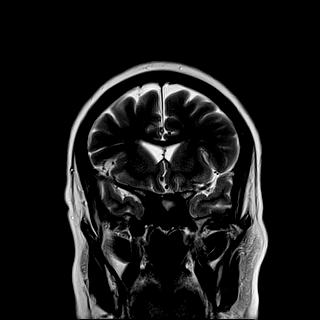
[im 14/28]
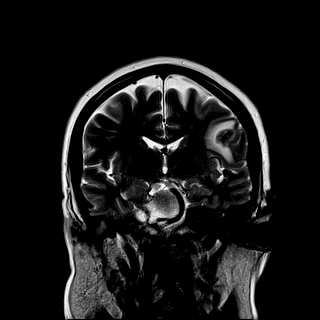
[im 19/28]
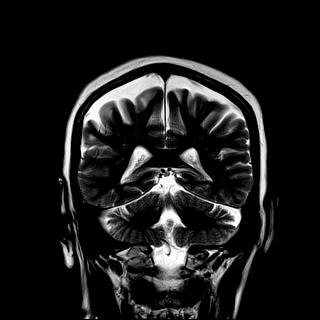
[im 23/28]
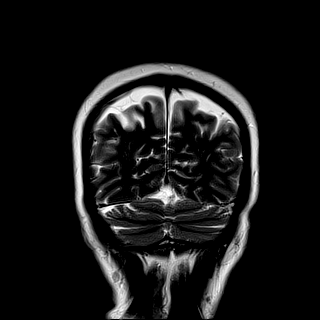
[im 28/28]
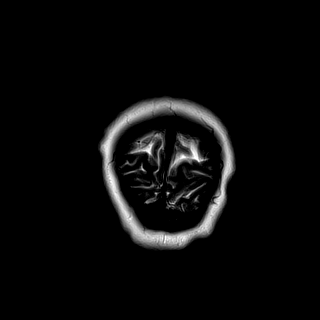

[Series 8: T1 post-contrast · coronal · 5.0mm · 0.34mm/px · 7 of 28 slices shown (1 of 2)]
[im 1/28]
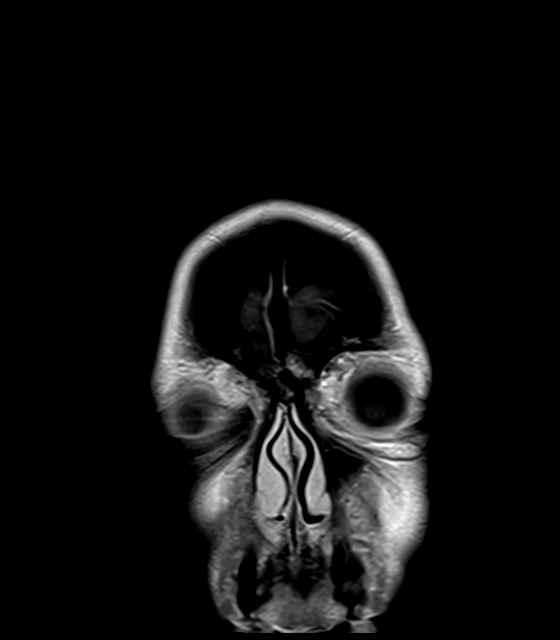
[im 5/28]
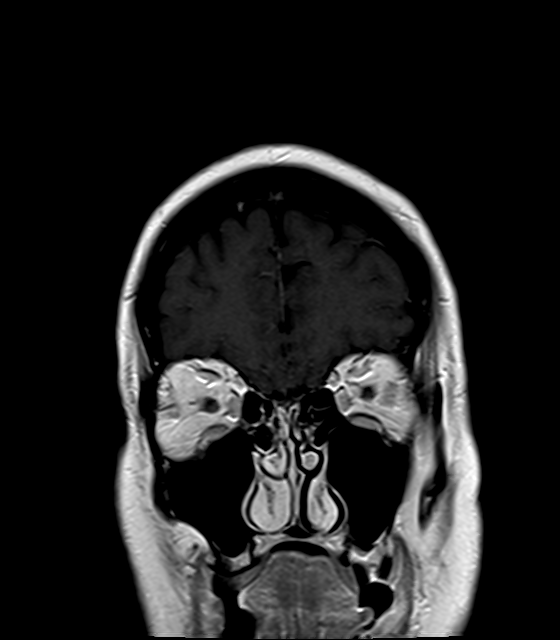
[im 10/28]
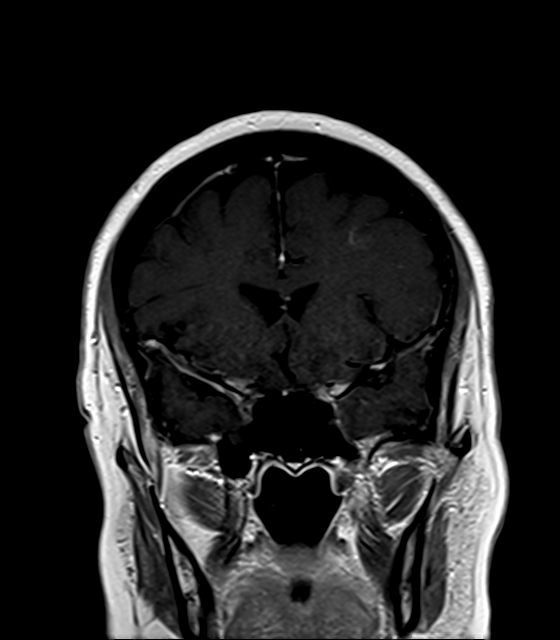
[im 14/28]
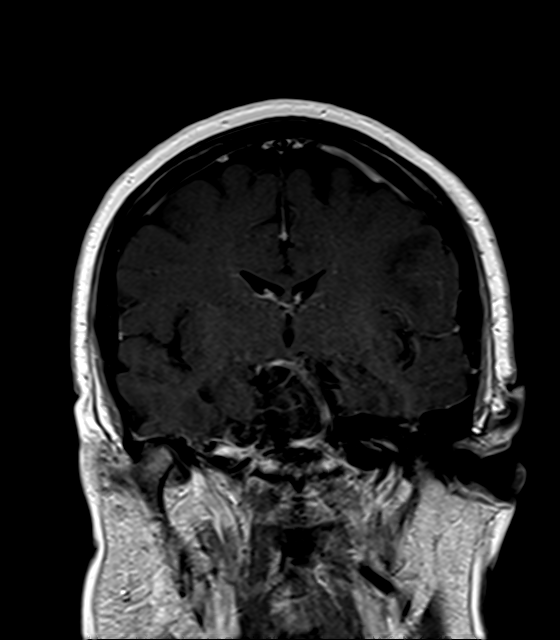
[im 19/28]
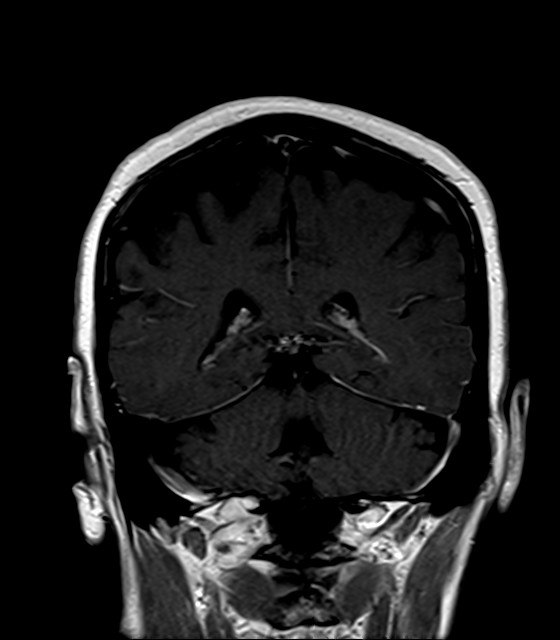
[im 23/28]
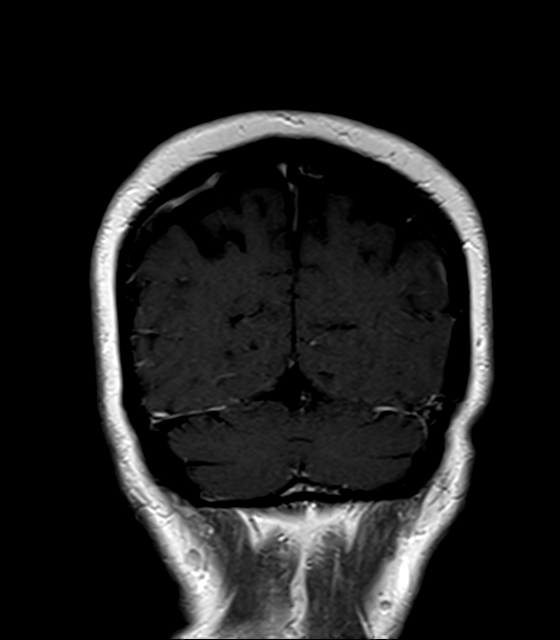
[im 28/28]
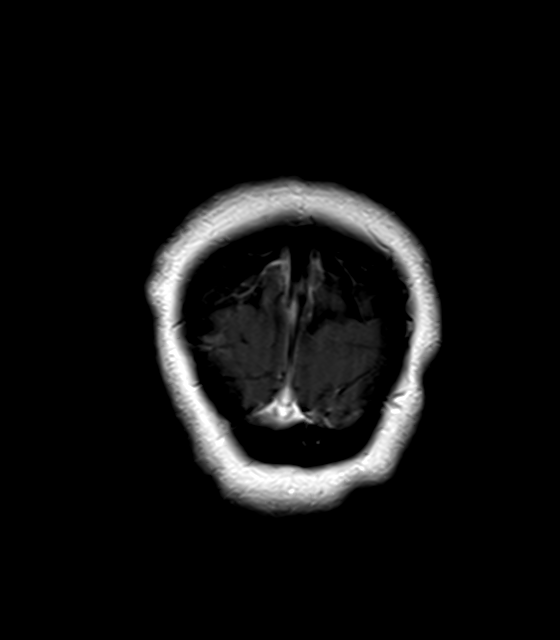

[Series 9: T1 post-contrast · sagittal · 5.0mm · 0.72mm/px · 5 of 22 slices shown (2 of 2)]
[im 1/22]
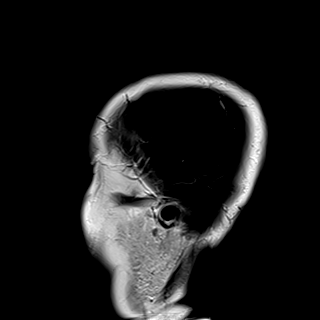
[im 6/22]
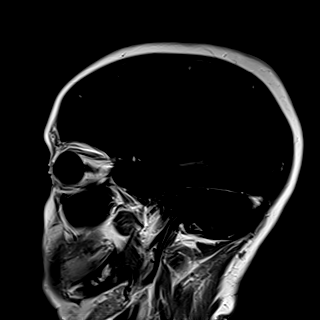
[im 11/22]
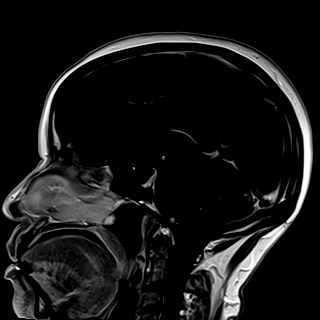
[im 16/22]
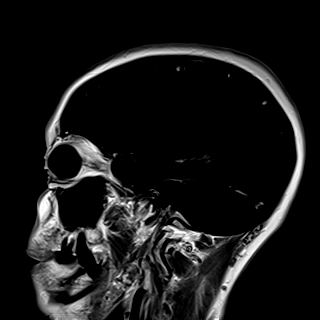
[im 22/22]
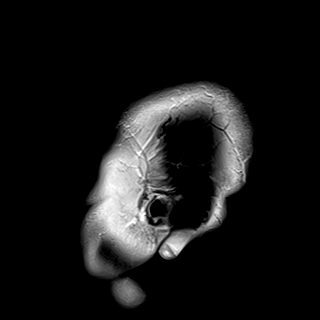

[19 of 48 positions shown; findings below may reference images not displayed]

FINDINGS: Brain:

A limited protocol brain MRI was performed as a follow-up to the
noncontrast brain MRI of [DATE]. For the current examination, a
pre contrast axial T1 weighted sequence was obtained as well as
axial, coronal and sagittal T1 weighted postcontrast sequences and a
postcontrast T2 weighted sequence.

There is unchanged size of a 2 cm acute/early subacute parenchymal
hemorrhage centered within the subcortical white matter of the left
frontal operculum. Unchanged mild-to-moderate surrounding vasogenic
edema. No definite masslike or nodular enhancement is demonstrated
at this site. There is adjacent vascular enhancement, which may be
reactive. Additionally, there is a probable small developmental
venous anomaly within the adjacent left frontal lobe (for instance
as seen on series 7, image 40).

No abnormal enhancement is identified elsewhere within the brain.

Vascular: Expected enhancement within the proximal large arterial
vessels and dural venous sinuses.

Skull and upper cervical spine: No enhancing suspicious calvarial
lesion.

Sinuses/Orbits: No enhancing orbital lesion is demonstrated mild
ethmoid sinus mucosal thickening.
IMPRESSION: 2 cm acute/early subacute parenchymal hemorrhage within the
subcortical left frontal operculum, unchanged as compared to the
head CT of [DATE]. There is adjacent vascular enhancement which
may be reactive. No definite mass-like or nodular enhancement is
demonstrated at this site on the current exam. However, short
interval MRI follow-up is recommended once the hematoma involutes to
further assess for an underlying lesion (i.e. primary or metastatic
neoplasm). Additionally, there is a probable small developmental
venous anomaly within the adjacent left frontal lobe and a cavernoma
or other underlying vascular lesion at site of the hematoma cannot
be excluded.

## 2020-01-27 IMAGING — CT CT ANGIO HEAD-NECK
1 of 8 series · 14 of 47 positions shown · IV contrast (OMNI)
Comparison: MRI and CT from [DATE]

CLINICAL DATA: Stroke follow-up. Slurred speech and facial
numbness.

EXAM:
CT ANGIOGRAPHY HEAD AND NECK
TECHNIQUE: Multidetector CT imaging of the head and neck was performed using
the standard protocol during bolus administration of intravenous
contrast. Multiplanar CT image reconstructions and MIPs were
obtained to evaluate the vascular anatomy. Carotid stenosis
measurements (when applicable) are obtained utilizing NASCET
criteria, using the distal internal carotid diameter as the
denominator.
CONTRAST:  75mL OMNIPAQUE IOHEXOL 350 MG/ML SOLN

[Series 12: thin · axial · 0.43mm/px · z∈[-310,-36]mm · 14 of 634 slices shown]
[im 43/634  brain]
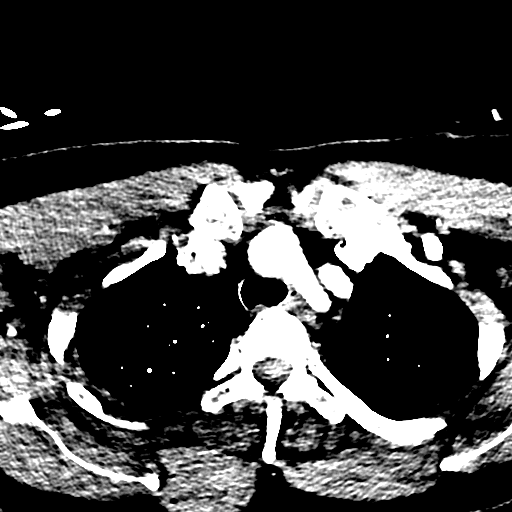
[im 85/634  bone]
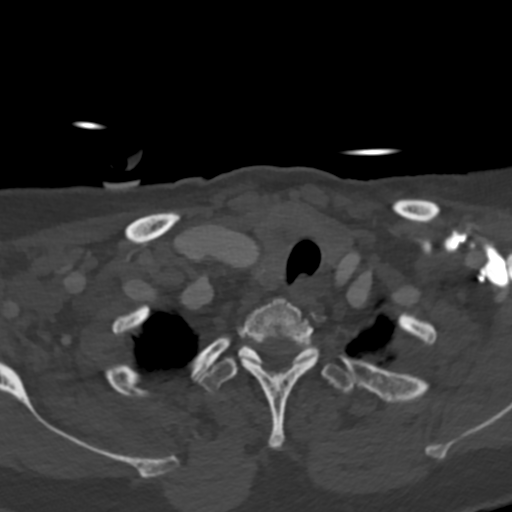
[im 127/634  brain]
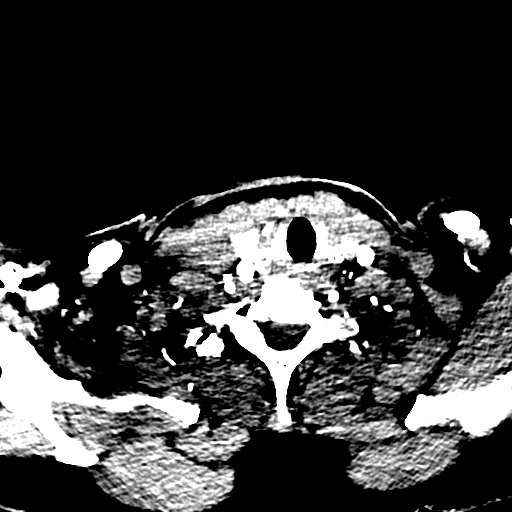
[im 169/634  bone]
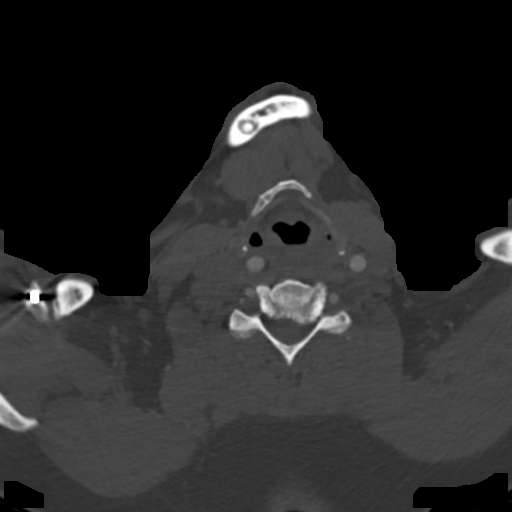
[im 212/634  brain]
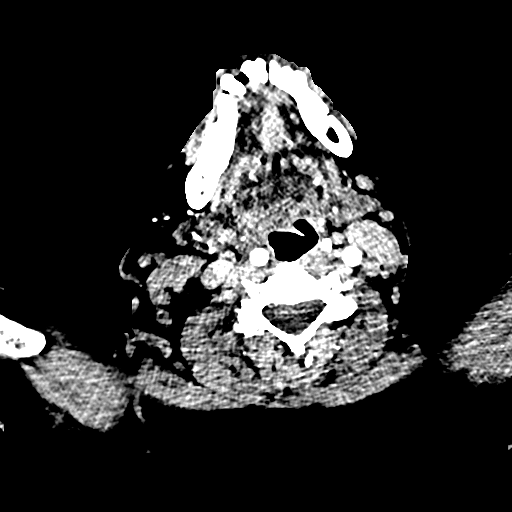
[im 254/634  bone]
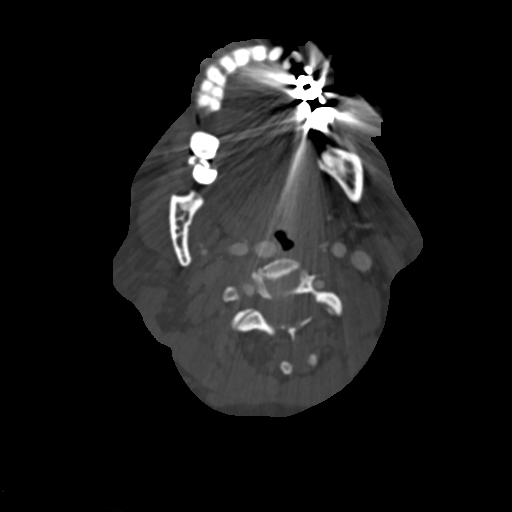
[im 296/634  brain]
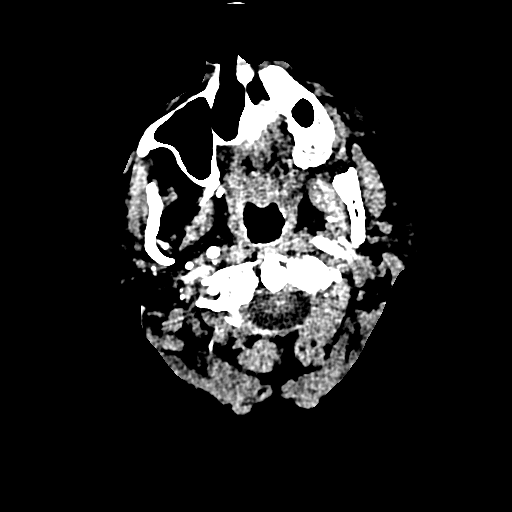
[im 338/634  bone]
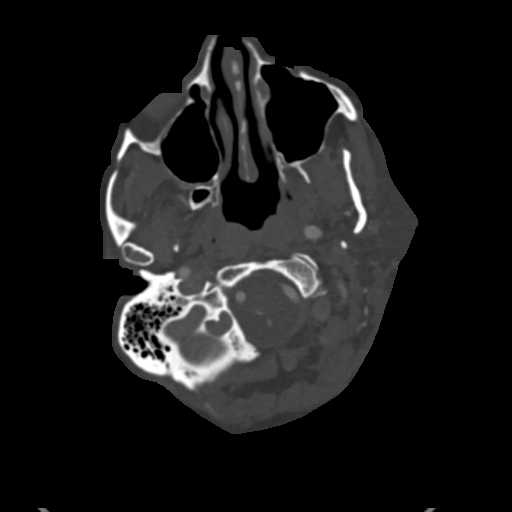
[im 380/634  brain]
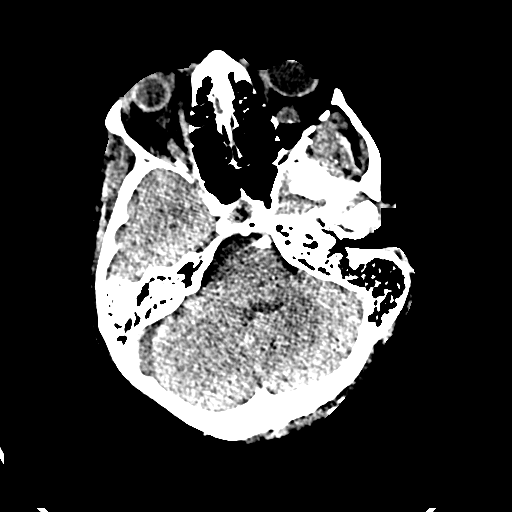
[im 423/634  bone]
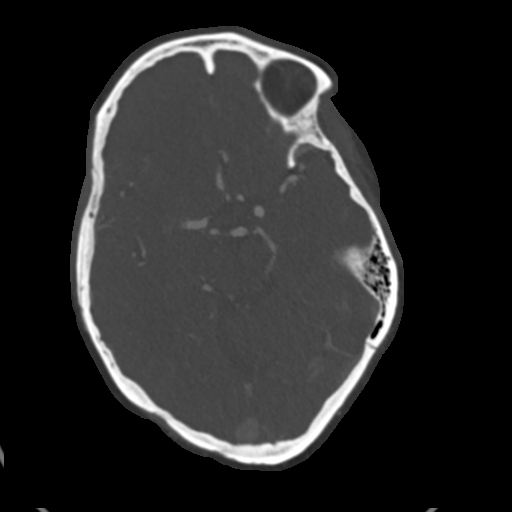
[im 465/634  brain]
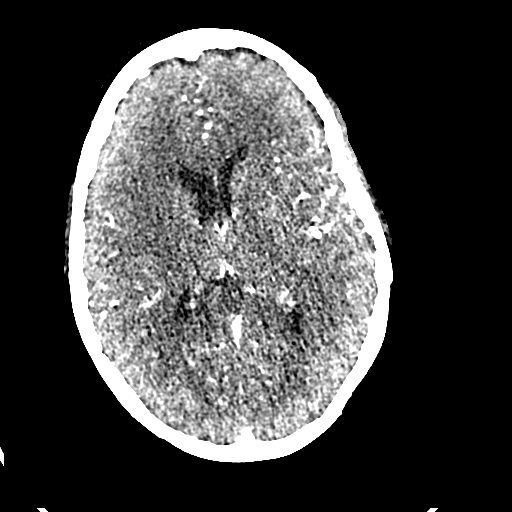
[im 507/634  bone]
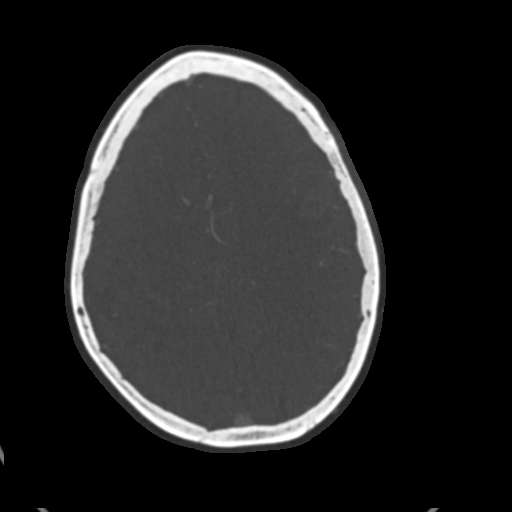
[im 549/634  brain]
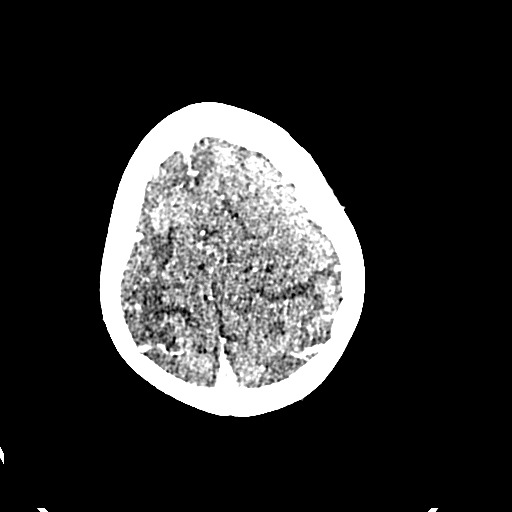
[im 591/634  bone]
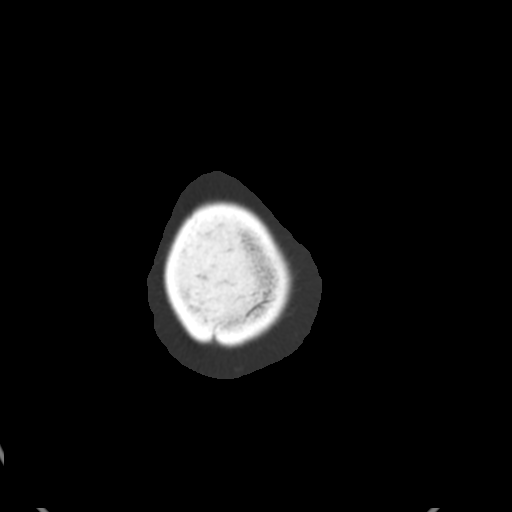

[14 of 47 positions shown; findings below may reference images not displayed]

FINDINGS: CTA NECK FINDINGS

Aortic arch: Imaged portion shows no evidence of aneurysm or
dissection. No significant stenosis of the major arch vessel
origins.

Right carotid system: No evidence of dissection, stenosis (50% or
greater) or occlusion.

Left carotid system: No evidence of dissection, stenosis (50% or
greater) or occlusion.

Vertebral arteries: Codominant. No evidence of dissection, stenosis
(50% or greater) or occlusion.

Skeleton: Degenerative changes of the cervical spine without
evidence of acute osseous abnormality.

Other neck: There are multiple thyroid nodules, measuring up to
cm, which does not require further imaging follow-up per ACR
guidelines.

Upper chest: Clear.

Review of the MIP images confirms the above findings

CTA HEAD FINDINGS

Similar size of the acute intraparenchymal hemorrhage centered in
the left frontal operculum, further characterized on same day CT
head and MRI.

Anterior circulation: No significant stenosis, proximal occlusion,
or aneurysm. No evidence of active extravasation in the region of
the known left frontal operculum hemorrhage. No underlying vascular
malformation identified; however, acute blood products limit
evaluation.

Posterior circulation: No significant stenosis, proximal occlusion,
aneurysm, or vascular malformation.

Venous sinuses: As permitted by contrast timing, patent.

Review of the MIP images confirms the above findings
IMPRESSION: 1. No significant arterial stenosis or proximal occlusion.
2. Similar size of the acute intraparenchymal hemorrhage centered in
the left frontal operculum, further characterized on same day CT
head and MRI. No underlying vascular malformation identified;
however, acute blood products limit evaluation.

## 2020-01-27 IMAGING — CT CT HEAD W/O CM
4 series · 16 of 47 positions shown, 18 images · non-contrast
Comparison: Brain MRI [DATE]

CLINICAL DATA: Intracranial hemorrhage

EXAM:
CT HEAD WITHOUT CONTRAST
TECHNIQUE: Contiguous axial images were obtained from the base of the skull
through the vertex without intravenous contrast.

[Series 3: head without · axial · non-contrast · 0.44mm/px · z∈[-205,-85]mm · 7 of 32 slices shown, 9 images]
[im 4/32  brain]
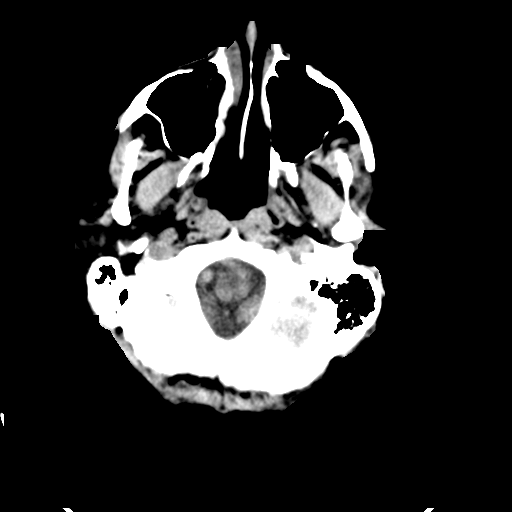
[im 4/32  bone]
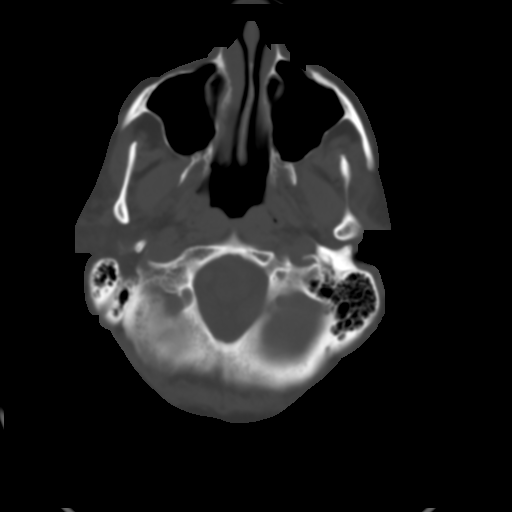
[im 8/32  brain]
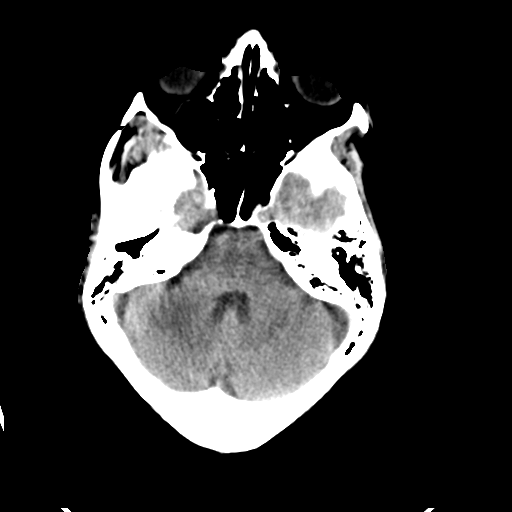
[im 12/32  brain]
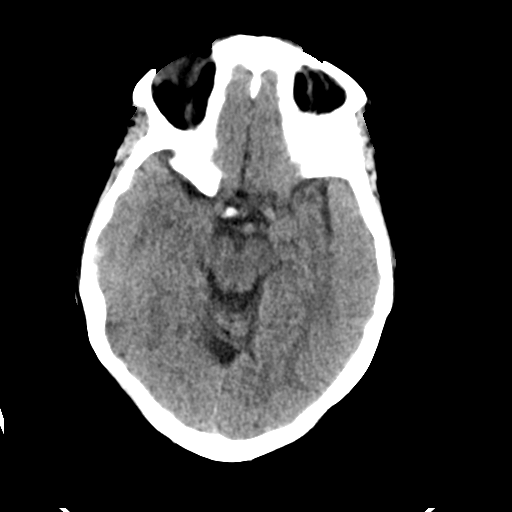
[im 16/32  brain]
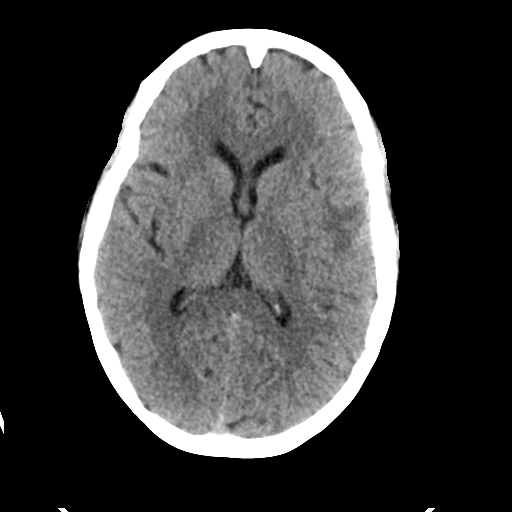
[im 20/32  brain]
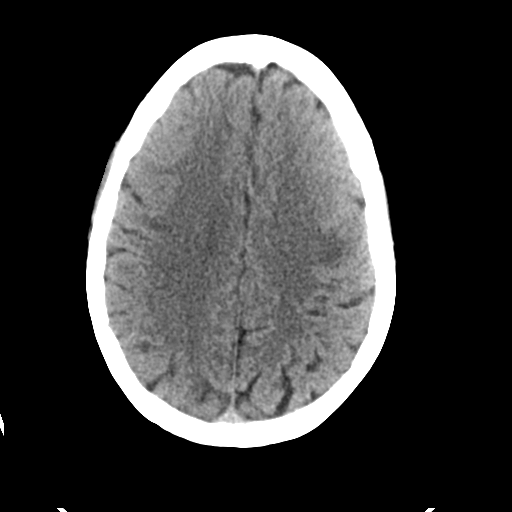
[im 20/32  bone]
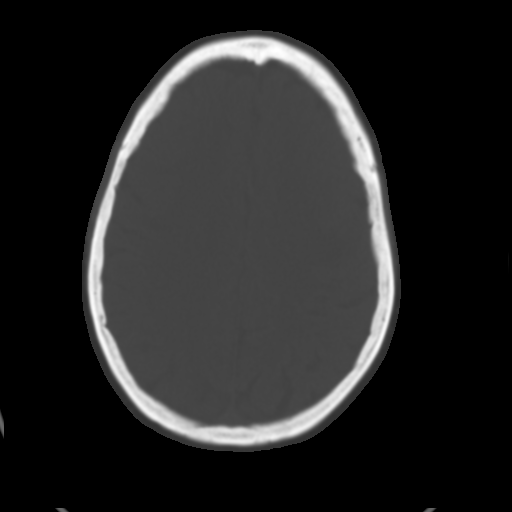
[im 24/32  brain]
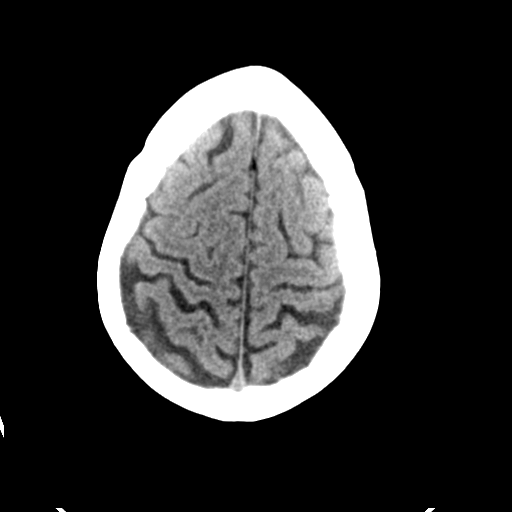
[im 28/32  brain]
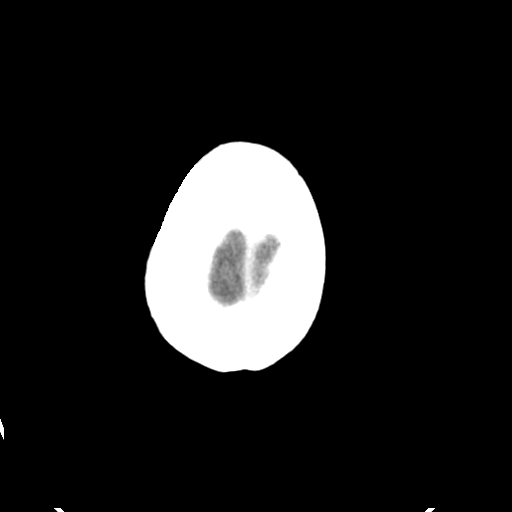

[Series 4: head bone · axial · 0.44mm/px · z∈[-206,-174]mm · 3 of 80 slices shown]
[im 8/80  bone]
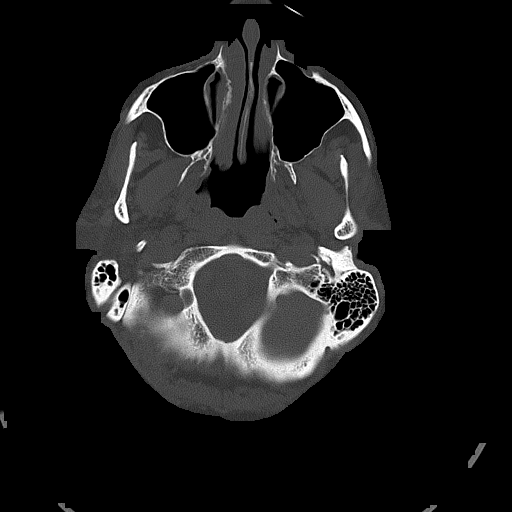
[im 16/80  bone]
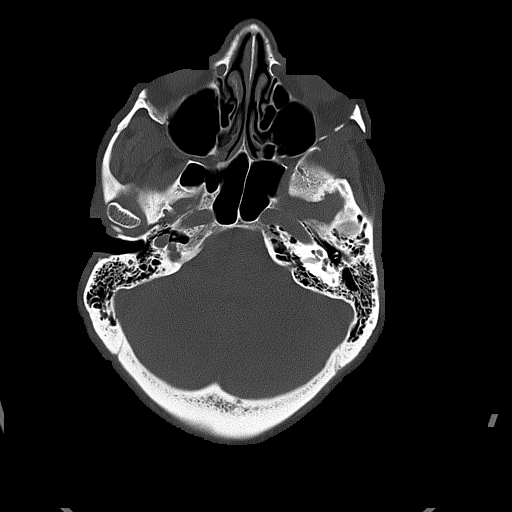
[im 24/80  bone]
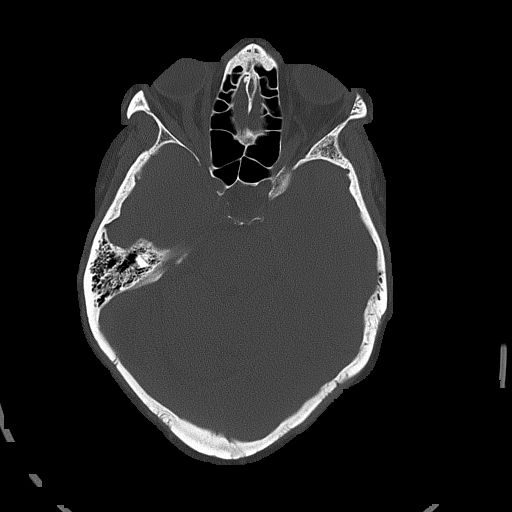

[Series 5: head without cor · coronal · non-contrast · 0.31mm/px · 3 of 72 slices shown]
[im 24/72  brain]
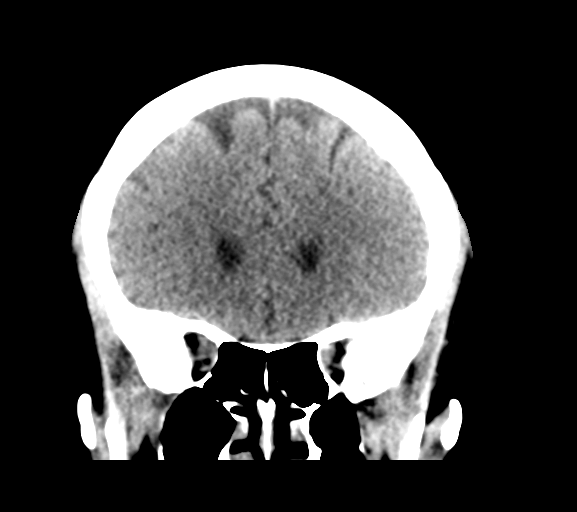
[im 32/72  brain]
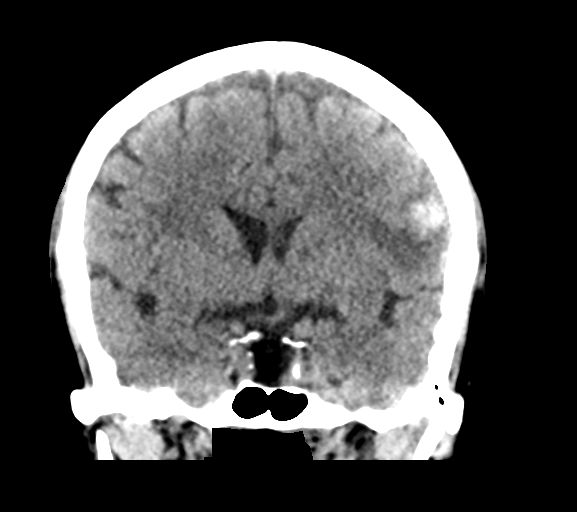
[im 40/72  brain]
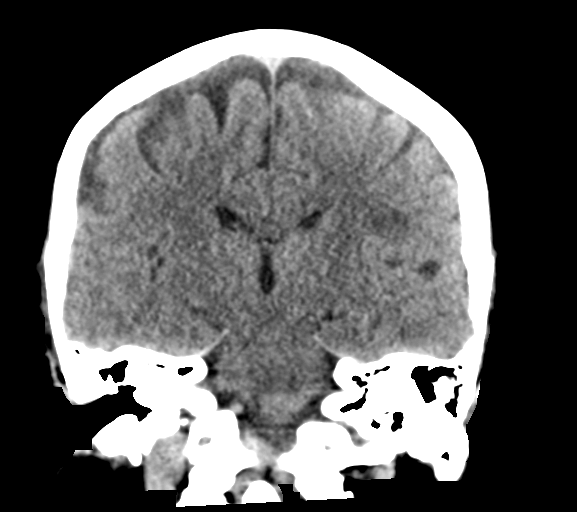

[Series 6: head without sag · sagittal · non-contrast · 0.31mm/px · 3 of 62 slices shown]
[im 21/62  brain]
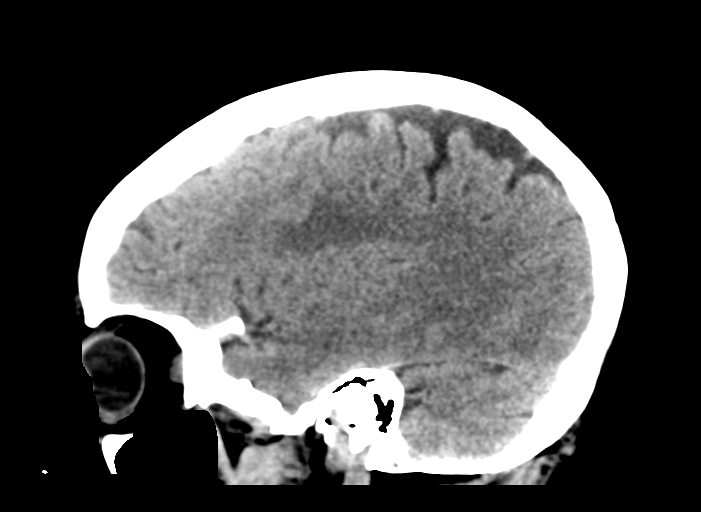
[im 31/62  brain]
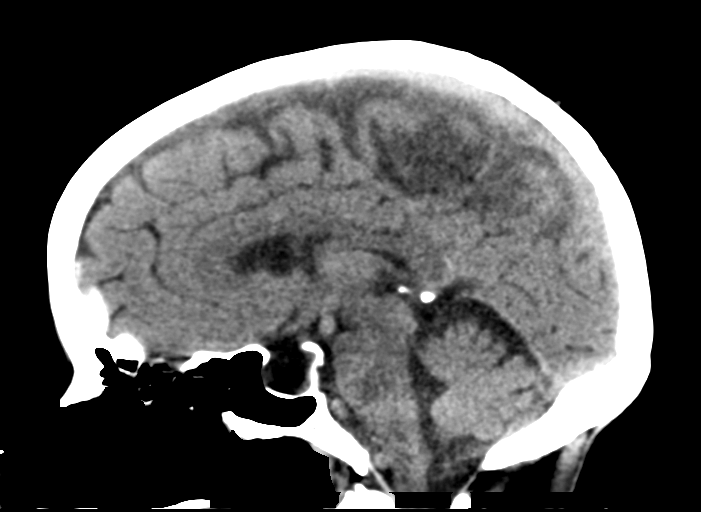
[im 41/62  brain]
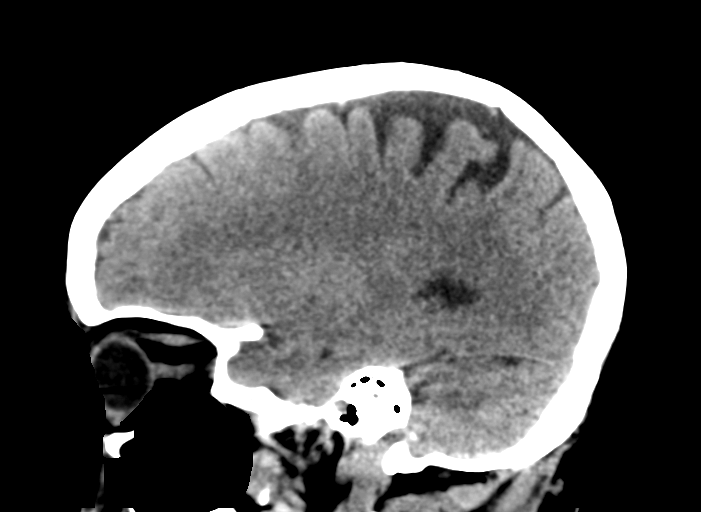

[16 of 47 positions shown; findings below may reference images not displayed]

FINDINGS: Brain: There is an acute intraparenchymal hemorrhage in the left
frontal operculum measuring 2 cm. There is mild adjacent edema. No
midline shift or other mass effect. The brain is otherwise normal.

Vascular: No hyperdense vessel or unexpected calcification.

Skull: Normal

Sinuses/Orbits: Normal

Other: None
IMPRESSION: 1. Acute intraparenchymal hemorrhage in the left frontal operculum
measuring 2 cm with mild adjacent edema.
2. No midline shift or other mass effect.

## 2020-01-27 MED ORDER — LABETALOL HCL 5 MG/ML IV SOLN: 10.0000 mg | INTRAVENOUS | Status: DC | PRN

## 2020-01-27 MED ORDER — AMLODIPINE-OLMESARTAN 10-40 MG PO TABS: 1.0000 | ORAL_TABLET | Freq: Every day | ORAL | Status: DC

## 2020-01-27 MED ORDER — SENNOSIDES-DOCUSATE SODIUM 8.6-50 MG PO TABS
1.0000 | ORAL_TABLET | Freq: Two times a day (BID) | ORAL | Status: DC
  Administered 2020-01-27 – 2020-01-28 (×3): 1 via ORAL
  Filled 2020-01-27 (×4): qty 1

## 2020-01-27 MED ORDER — IRBESARTAN 300 MG PO TABS
300.0000 mg | ORAL_TABLET | Freq: Every day | ORAL | Status: DC
  Administered 2020-01-27 – 2020-01-28 (×2): 300 mg via ORAL
  Filled 2020-01-27 (×3): qty 1

## 2020-01-27 MED ORDER — STROKE: EARLY STAGES OF RECOVERY BOOK: Freq: Once | Status: DC

## 2020-01-27 MED ORDER — ACETAMINOPHEN 325 MG PO TABS
650.0000 mg | ORAL_TABLET | ORAL | Status: DC | PRN
  Administered 2020-01-27: 650 mg via ORAL
  Filled 2020-01-27: qty 2

## 2020-01-27 MED ORDER — LABETALOL HCL 5 MG/ML IV SOLN
10.0000 mg | Freq: Once | INTRAVENOUS | Status: AC
  Administered 2020-01-27: 10 mg via INTRAVENOUS
  Filled 2020-01-27: qty 4

## 2020-01-27 MED ORDER — ACETAMINOPHEN 160 MG/5ML PO SOLN: 650.0000 mg | ORAL | Status: DC | PRN

## 2020-01-27 MED ORDER — PANTOPRAZOLE SODIUM 40 MG IV SOLR
40.0000 mg | Freq: Every day | INTRAVENOUS | Status: DC
  Administered 2020-01-27: 40 mg via INTRAVENOUS
  Filled 2020-01-27: qty 40

## 2020-01-27 MED ORDER — IRBESARTAN 300 MG PO TABS
300.0000 mg | ORAL_TABLET | Freq: Every day | ORAL | Status: DC
  Filled 2020-01-27: qty 1

## 2020-01-27 MED ORDER — IOHEXOL 350 MG/ML SOLN
75.0000 mL | Freq: Once | INTRAVENOUS | Status: AC | PRN
  Administered 2020-01-27: 75 mL via INTRAVENOUS

## 2020-01-27 MED ORDER — LORAZEPAM 2 MG/ML IJ SOLN
1.0000 mg | Freq: Once | INTRAMUSCULAR | Status: AC
  Administered 2020-01-27: 1 mg via INTRAVENOUS
  Filled 2020-01-27: qty 1

## 2020-01-27 MED ORDER — GADOBUTROL 1 MMOL/ML IV SOLN
8.0000 mL | Freq: Once | INTRAVENOUS | Status: AC | PRN
  Administered 2020-01-27: 8 mL via INTRAVENOUS

## 2020-01-27 MED ORDER — SODIUM CHLORIDE 0.9% FLUSH
3.0000 mL | Freq: Once | INTRAVENOUS | Status: AC
  Administered 2020-01-27: 3 mL via INTRAVENOUS

## 2020-01-27 MED ORDER — ACETAMINOPHEN 650 MG RE SUPP: 650.0000 mg | RECTAL | Status: DC | PRN

## 2020-01-27 MED ORDER — METOPROLOL TARTRATE 50 MG PO TABS
50.0000 mg | ORAL_TABLET | Freq: Two times a day (BID) | ORAL | Status: DC
  Administered 2020-01-27 – 2020-01-28 (×3): 50 mg via ORAL
  Filled 2020-01-27: qty 1
  Filled 2020-01-27: qty 2
  Filled 2020-01-27: qty 1

## 2020-01-27 MED ORDER — AMLODIPINE BESYLATE 10 MG PO TABS
10.0000 mg | ORAL_TABLET | Freq: Every day | ORAL | Status: DC
  Administered 2020-01-27 – 2020-01-28 (×2): 10 mg via ORAL
  Filled 2020-01-27: qty 2
  Filled 2020-01-27: qty 1

## 2020-01-27 MED ORDER — METOPROLOL TARTRATE 25 MG PO TABS: 50.0000 mg | ORAL_TABLET | Freq: Two times a day (BID) | ORAL | Status: DC

## 2020-01-27 MED ORDER — AMLODIPINE BESYLATE 5 MG PO TABS: 10.0000 mg | ORAL_TABLET | Freq: Every day | ORAL | Status: DC

## 2020-01-27 NOTE — ED Notes (Signed)
Attempted report 

## 2020-01-27 NOTE — ED Triage Notes (Signed)
Pt reports waking up Sunday with slurred speech and facial numbness. Went to pcp and sent for outpatient MRI. Sent here due to +hemorrhagic lesion noted on MRI. Only deficit noted at triage is slurred speech, no extremity weakness noted.

## 2020-01-27 NOTE — ED Provider Notes (Signed)
[facility] EMERGENCY DEPARTMENT Provider Note   CSN: [id number] Arrival date & time: 01/27/20  0055     History Chief Complaint  Patient presents with  . Aphasia    Yesenia Bishop is a 69 y.o. female.  Patient presents to the emergency department at the request of her primary care doctor.  She reports waking up yesterday morning Sunday Aug 29) with altered sensation of the left side of her face and difficulty speaking.  She saw her primary care doctor today who arranged for outpatient MRI.  She was called tonight and told that she had bleeding in her brain and she should come to the emergency department.        Past Medical History:  Diagnosis Date  . GERD (gastroesophageal reflux disease)   . HTN (hypertension)   . Mixed hyperlipidemia 08/28/2018    Patient Active Problem List   Diagnosis Date Noted  . 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 Constipation 07/28/2019  . GERD (gastroesophageal reflux disease) 07/28/2019  . Dysphagia 07/28/2019  . Nausea with vomiting 07/28/2019  . LUQ pain 07/28/2019  . Mixed hyperlipidemia 08/28/2018  . NSTEMI (non-ST elevated myocardial infarction) ([place]) 08/27/2018  . Essential hypertension 08/27/2018  . Non-ST elevation (NSTEMI) myocardial infarction ([place]) 08/27/2018  . Varicose veins of leg with complications 59/93/5701    Past Surgical History:  Procedure Laterality Date  . 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 APPENDECTOMY    . BIOPSY  08/13/2019   Procedure: BIOPSY;  Surgeon: Daneil Dolin, MD;  [facility];  Service: Endoscopy;;  . CHOLECYSTECTOMY    . COLONOSCOPY  12/2010   Dr. Sherrlyn Hock: Excellent..  Unable to navigate proximal to the hepatic flexure.  Otherwise visualized colon was normal.  Consider propofol, exam following up with barium enema.  . ESOPHAGOGASTRODUODENOSCOPY N/A 08/13/2019   Rourk: Esophagus normal, status post dilation for history of  dysphagia.  Large hiatal hernia.  Gastric polyps removed, hyperplastic.  Marland Kitchen LEFT HEART CATH AND CORONARY ANGIOGRAPHY N/A 08/28/2018   Procedure: LEFT HEART CATH AND CORONARY ANGIOGRAPHY;  Surgeon: Adrian Prows, MD;  Location: [facility];  Service: Cardiovascular;  Laterality: N/A;  . Venia Minks DILATION N/A 08/13/2019   Procedure: Venia Minks DILATION;  Surgeon: Daneil Dolin, MD;  Location: [facility];  Service: Endoscopy;  Laterality: N/A;  . SHOULDER SURGERY Right   . TUBAL LIGATION       OB History   No obstetric history on file.     Family History  Problem Relation Age of Onset  . Diabetes Father   . Heart disease Father   . Hyperlipidemia Father   . Hypertension Father   . Varicose Veins Father   . Hypertension Brother     Social History   Tobacco Use  . Smoking status: Never Smoker  . Smokeless tobacco: Never Used  Vaping Use  . Vaping Use: Never used  Substance Use Topics  . Alcohol use: Yes    Alcohol/week: 1.0 standard drink    Types: 1 Glasses of wine per week    Comment: socially  . Drug use: No    Home Medications Prior to Admission medications   Medication Sig Start Date End Date Taking? Authorizing Provider  amLODipine-olmesartan (AZOR) 10-40 MG per tablet Take 1 tablet by mouth daily.   Yes [provider]  aspirin 81 MG tablet Take 81 mg by mouth at bedtime.    Yes  [provider]  Calcium Carbonate-Vitamin D (CALTRATE 600+D PO) Take 1 tablet by mouth daily.   Yes [provider]  Carboxymethylcellul-Glycerin (LUBRICATING EYE DROPS OP) Place 1 drop into both eyes daily as needed (dry eyes).   Yes [provider]  Garlic 7858 MG CAPS Take 1,000 mg by mouth daily.   Yes [provider]  metoprolol tartrate (LOPRESSOR) 100 MG tablet Take 50 mg by mouth 2 (two) times daily.   Yes [provider]  nitroGLYCERIN (NITROSTAT) 0.4 MG SL tablet Place 1 tablet (0.4 mg total) under the tongue every 5 (five)  minutes x 3 doses as needed for chest pain. 08/28/18  Yes Adrian Prows, MD  omeprazole (PRILOSEC) 40 MG capsule Take 40 mg by mouth in the morning and at bedtime.    Yes [provider]    Allergies    Patient has no known allergies.  Review of Systems   Review of Systems  Neurological: Positive for speech difficulty and numbness.  All other systems reviewed and are negative.   Physical Exam Updated Vital Signs BP (!) 157/89   Pulse 75   Temp 97.6 F (36.4 C) (Oral)   Resp 17   Ht 5\' 4"  (1.626 m)   Wt 95.3 kg   SpO2 98%   BMI 36.05 kg/m   Physical Exam Vitals and nursing note reviewed.  Constitutional:      General: She is not in acute distress.    Appearance: Normal appearance. She is well-developed.  HENT:     Head: Normocephalic and atraumatic.     Right Ear: Hearing normal.     Left Ear: Hearing normal.     Nose: Nose normal.  Eyes:     Conjunctiva/sclera: Conjunctivae normal.     Pupils: Pupils are equal, round, and reactive to light.  Cardiovascular:     Rate and Rhythm: Regular rhythm.     Heart sounds: S1 normal and S2 normal. No murmur heard.  No friction rub. No gallop.   Pulmonary:     Effort: Pulmonary effort is normal. No respiratory distress.     Breath sounds: Normal breath sounds.  Chest:     Chest wall: No tenderness.  Abdominal:     General: Bowel sounds are normal.     Palpations: Abdomen is soft.     Tenderness: There is no abdominal tenderness. There is no guarding or rebound. Negative signs include Murphy's sign and McBurney's sign.     Hernia: No hernia is present.  Musculoskeletal:        General: Normal range of motion.     Cervical back: Normal range of motion and neck supple.  Skin:    General: Skin is warm and dry.     Findings: No rash.  Neurological:     Mental Status: She is alert and oriented to person, place, and time.     GCS: GCS eye subscore is 4. GCS verbal subscore is 5. GCS motor subscore is 6.     Cranial  Nerves: No cranial nerve deficit.     Sensory: Sensory deficit (Left face) present.     Coordination: Coordination normal.     Comments: Speech is slurred and at times she has difficulty finding words  Psychiatric:        Speech: Speech normal.        Behavior: Behavior normal.        Thought Content: Thought content normal.     ED Results / Procedures /  Treatments   Labs (all labs ordered are listed, but only abnormal results are displayed) Labs Reviewed  CBC - Abnormal; Notable for the following components:      Result Value   Hemoglobin 10.5 (*)    HCT 34.3 (*)    MCH 24.6 (*)    RDW 18.5 (*)    All other components within normal limits  COMPREHENSIVE METABOLIC PANEL - Abnormal; Notable for the following components:   Glucose, Bld 123 (*)    Creatinine, Ser 1.18 (*)    Total Bilirubin <0.1 (*)    GFR calc non Af Amer 47 (*)    GFR calc Af Amer 55 (*)    All other components within normal limits  SARS CORONAVIRUS 2 BY RT PCR (HOSPITAL ORDER, [facility])  PROTIME-INR  DIFFERENTIAL  HIV ANTIBODY (ROUTINE TESTING W REFLEX)  PROTIME-INR  APTT    EKG EKG Interpretation  Date/Time:  Tuesday January 27 2020 01:05:54 EDT Ventricular Rate:  67 PR Interval:  186 QRS Duration: 82 QT Interval:  418 QTC Calculation: 441 R Axis:   51 Text Interpretation: Normal sinus rhythm Nonspecific T wave abnormality Abnormal ECG Confirmed by Orpah Greek [phone]) on 01/27/2020 1:38:07 AM   Radiology CT HEAD WO CONTRAST  Result Date: 01/27/2020 CLINICAL DATA:  Intracranial hemorrhage EXAM: CT HEAD WITHOUT CONTRAST TECHNIQUE: Contiguous axial images were obtained from the base of the skull through the vertex without intravenous contrast. COMPARISON:  Brain MRI 01/26/2020 FINDINGS: Brain: There is an acute intraparenchymal hemorrhage in the left frontal operculum measuring 2 cm. There is mild adjacent edema. No midline shift or other mass effect. The brain  is otherwise normal. Vascular: No hyperdense vessel or unexpected calcification. Skull: Normal Sinuses/Orbits: Normal Other: None IMPRESSION: 1. Acute intraparenchymal hemorrhage in the left frontal operculum measuring 2 cm with mild adjacent edema. 2. No midline shift or other mass effect. Electronically Signed   By: Ulyses Jarred M.D.   On: 01/27/2020 02:09   MR BRAIN WO CONTRAST  Addendum Date: 01/26/2020   ADDENDUM REPORT: 01/26/2020 17:34 ADDENDUM: These results were called by telephone at the time of interpretation on 01/26/2020 at 5:33 pm to provider Dr. Jerene Bears , who verbally acknowledged these results. Electronically Signed   By: Kellie Simmering DO   On: 01/26/2020 17:34   Result Date: 01/26/2020 CLINICAL DATA:  Slurred speech. Additional history provided: Left-sided weakness and slurred speech for 2 days. EXAM: MRI HEAD WITHOUT CONTRAST TECHNIQUE: Multiplanar, multiecho pulse sequences of the brain and surrounding structures were obtained without intravenous contrast. COMPARISON:  No pertinent prior exams are available for comparison. FINDINGS: Brain: There is a 2.2 cm hemorrhagic focus within the left frontal operculum which appears centered within the subcortical white matter (for instance as seen on series 10, image 13 and series 13, image 38). There is corresponding SWI signal loss at this site. There is a small to moderate amount of surrounding edema. Susceptibility artifact from blood products at this site limits evaluation for associated restricted diffusion. Mild background scattered T2/FLAIR hyperintensity within the cerebral white matter is nonspecific, but likely reflect sequela of chronic small vessel ischemic disease. No extra-axial fluid collection. No midline shift. Partially empty sella turcica. Vascular: Expected proximal arterial flow voids. Skull and upper cervical spine: No focal marrow lesion. Sinuses/Orbits: Visualized orbits show no acute finding. Trace ethmoid sinus mucosal  thickening. No significant mastoid effusion. IMPRESSION: 2.2 cm hemorrhagic lesion centered within the subcortical white matter of the  left frontal operculum with surrounding small to moderate edema. Differential considerations include a parenchymal hemorrhage with associated edema, primary or metastatic hemorrhagic neoplasm or hemorrhagic conversion of a recent white matter infarct, among others. Noncontrast head CT is recommended to assess for any acute component of hemorrhage. Additionally, contrast-enhanced brain MRI is recommended for further characterization. Background mild multifocal T2 hyperintensity within the cerebral white matter which is nonspecific, but likely reflects chronic small vessel ischemic disease. Trace ethmoid sinus mucosal thickening. Electronically Signed: By: Kellie Simmering DO On: 01/26/2020 17:27    Procedures Procedures (including critical care time)  Medications Ordered in ED Medications   stroke: mapping our early stages of recovery book (has no administration in time range)  acetaminophen (TYLENOL) tablet 650 mg (has no administration in time range)    Or  acetaminophen (TYLENOL) 160 MG/5ML solution 650 mg (has no administration in time range)    Or  acetaminophen (TYLENOL) suppository 650 mg (has no administration in time range)  senna-docusate (Senokot-S) tablet 1 tablet (1 tablet Oral Given 01/27/20 0305)  pantoprazole (PROTONIX) injection 40 mg (has no administration in time range)  amLODipine-olmesartan (AZOR) 10-40 MG per tablet 1 tablet (has no administration in time range)  metoprolol tartrate (LOPRESSOR) tablet 50 mg (has no administration in time range)  labetalol (NORMODYNE) injection 10 mg (has no administration in time range)  sodium chloride flush (NS) 0.9 % injection 3 mL (3 mLs Intravenous Given 01/27/20 0146)  labetalol (NORMODYNE) injection 10 mg (10 mg Intravenous Given 01/27/20 0305)    ED Course  I have reviewed the triage vital signs and the  nursing notes.  Pertinent labs & imaging results that were available during my care of the patient were reviewed by me and considered in my medical decision making (see chart for details).    MDM Rules/Calculators/A&P                          Patient presents to the emergency department with strokelike symptoms.  Patient had an outpatient MRI earlier today that shows parenchymal hemorrhage.  Patient's blood pressure was elevated at arrival but then after she was brought to the room it improved into the 140/80 range, will need to maintain around this pressure.  Discussed with Dr. Leonel Ramsay, on-call for neurology.  He has seen and evaluated the patient, will admit her to neurology service.  CRITICAL CARE Performed by: Orpah Greek   Total critical care time: 30 minutes  Critical care time was exclusive of separately billable procedures and treating other patients.  Critical care was necessary to treat or prevent imminent or life-threatening deterioration.  Critical care was time spent personally by me on the following activities: development of treatment plan with patient and/or surrogate as well as nursing, discussions with consultants, evaluation of patient's response to treatment, examination of patient, obtaining history from patient or surrogate, ordering and performing treatments and interventions, ordering and review of laboratory studies, ordering and review of radiographic studies, pulse oximetry and re-evaluation of patient's condition.    Final Clinical Impression(s) / ED Diagnoses Final diagnoses:  Hemorrhagic stroke [facility])    Rx / DC Orders ED Discharge Orders    None       Romulus Hanrahan, Gwenyth Allegra, MD 01/27/20 [id number]

## 2020-01-27 NOTE — ED Notes (Signed)
MD Leonel Ramsay called RN back , states to holds off on MRI at this time ,RN will continue to monitor

## 2020-01-27 NOTE — Progress Notes (Signed)
  Echocardiogram 2D Echocardiogram has been performed.  Michiel Cowboy 01/27/2020, 9:54 AM

## 2020-01-27 NOTE — ED Notes (Signed)
RN spoke with MD Pollina regarding pt BP , and he states to closely monitor, and reports we want the pt's BP 050'K or below systolic , that Labetalol can be held for now until bp increases.

## 2020-01-27 NOTE — ED Notes (Signed)
Pt bought back from MRI without completion R.T muscle spasms ,MD will be paged for medication, pt resting on stretcher currently

## 2020-01-27 NOTE — ED Notes (Addendum)
MD Leonel Ramsay calls RN again to infirm of new target SBP of 150 and below

## 2020-01-27 NOTE — Progress Notes (Signed)
STROKE TEAM PROGRESS NOTE   HISTORY OF PRESENT ILLNESS (per record) Yesenia Bishop is a 69 y.o. female with a history of hypertension who presents with slurred speech that started on awakening Sunday morning.  She states that she also had some headache that started this morning. Due to this she sought care at her PCP who sent her for an MRI.  This revealed a hemorrhagic lesion and she was therefore referred to the emergency department. She denies extremity weakness or numbness, difficulty walking, double vision, or other symptoms.  She takes aspirin but no other blood thinners. LKW: 8/29 prior to bed tpa given?: No, ICH IR Thrombectomy? No, ICH Modified Rankin Scale: 0-Completely asymptomatic and back to baseline post- stroke NIHSS: 4(face, arm, leg, dysarthria)   INTERVAL HISTORY Her RN is at the bedside.  I personally reviewed history of presenting illness, electronic medical records and imaging films in PACS.  She presented with headache and slurred speech and CT scan as well as MRI showed a 2 cm left frontal subcortical hemorrhage with mild cytotoxic edema.  Blood pressure has been adequately controlled.  Neurological exam is remained stable.   OBJECTIVE Vitals:   01/27/20 0545 01/27/20 0600 01/27/20 0615 01/27/20 0630  BP: 132/82 134/83 130/78 122/82  Pulse: 60 (!) 58 (!) 58 (!) 57  Resp: 17 12 15 12   Temp:      TempSrc:      SpO2: 95% 97% 97% 95%  Weight:      Height:        CBC:  Recent Labs  Lab 01/27/20 0125  WBC 8.4  NEUTROABS 5.3  HGB 10.5*  HCT 34.3*  MCV 80.3  PLT 427    Basic Metabolic Panel:  Recent Labs  Lab 01/27/20 0125  NA 139  K 3.7  CL 103  CO2 24  GLUCOSE 123*  BUN 15  CREATININE 1.18*  CALCIUM 10.1    Lipid Panel:     Component Value Date/Time   CHOL 216 (H) 08/27/2018 2024   TRIG 74 08/27/2018 2024   HDL 85 08/27/2018 2024   CHOLHDL 2.5 08/27/2018 2024   VLDL 15 08/27/2018 2024   LDLCALC 116 (H) 08/27/2018 2024   HgbA1c:  Lab  Results  Component Value Date   HGBA1C 6.2 (H) 01/27/2020   Urine Drug Screen: No results found for: LABOPIA, COCAINSCRNUR, LABBENZ, AMPHETMU, THCU, LABBARB  Alcohol Level No results found for: [place]  CT HEAD WO CONTRAST 01/27/2020 IMPRESSION:  1. Acute intraparenchymal hemorrhage in the left frontal operculum measuring 2 cm with mild adjacent edema.  2. No midline shift or other mass effect.   MR BRAIN WO CONTRAST 01/26/2020   MPRESSION:   2.2 cm hemorrhagic lesion centered within the subcortical white matter of the left frontal operculum with surrounding small to moderate edema.   Differential considerations include a parenchymal hemorrhage with associated edema, primary or metastatic hemorrhagic neoplasm or hemorrhagic conversion of a recent white matter infarct, among others.   Noncontrast head CT is recommended to assess for any acute component of hemorrhage.   Additionally, contrast-enhanced brain MRI is recommended for further characterization.   Background mild multifocal T2 hyperintensity within the cerebral white matter which is nonspecific, but likely reflects chronic small vessel ischemic disease.   Trace ethmoid sinus mucosal thickening.   Transthoracic Echocardiogram  00/00/2021 Pending  ECG - SR rate 67 BPM. (See cardiology reading for complete details)   PHYSICAL EXAM Blood pressure 122/82, pulse (!) 57, temperature 97.6  F (36.4 C), temperature source Oral, resp. rate 12, height 5\' 4"  (1.626 m), weight 95.3 kg, SpO2 95 %. Pleasant middle-age lady not in distress. . Afebrile. Head is nontraumatic. Neck is supple without bruit.    Cardiac exam no murmur or gallop. Lungs are clear to auscultation. Distal pulses are well felt.  Neurological Exam ;  Awake  Alert oriented x 3.  Mildly dysarthric speech but normal language.eye movements full without nystagmus.fundi were not visualized. Vision acuity and fields appear normal. Hearing is normal. Palatal  movements are normal. Face asymmetric with mild right lower facial weakness.. Tongue midline. Mild right hemiparesis for/5 strength with weakness of right grip and intrinsic hand muscles and orbits left over right upper extremity.  Mild weakness of right hip flexors and ankle dorsiflexors only.. Normal sensation. Gait deferred.   ASSESSMENT/PLAN [name] is a 69 y.o. female with history of hypertension, CKD and GERD who presents with slurred speech and headache. PCP ordered MRI -> hemorrhage. She did not receive IV t-PA due to [place]  Stroke: Acute intraparenchymal hemorrhage in the left frontal operculum.  Resultant  Right hemiparesis   code Stroke CT Head - not ordered    CT head - Acute intraparenchymal hemorrhage in the left frontal operculum measuring 2 cm with mild adjacent edema.   MRI head - 2.2 cm hemorrhagic lesion centered within the subcortical white matter of the left frontal operculum with surrounding small to moderate edema. Contrast-enhanced brain MRI is recommended for further characterization.  MRA H&N - pending  CTA H&N - not ordered  CT Perfusion - not ordered  Carotid Doppler - not indicated  2D Echo - pending  Sars Corona Virus 2 - negative  LDL  - pending  HgbA1c - 6.2  UDS - not ordered  VTE prophylaxis - SCDs Diet  Diet Order            Diet NPO time specified  Diet effective now                 aspirin 81 mg daily prior to admission, now on No antithrombotic  Ongoing aggressive stroke risk factor management  Therapy recommendations:  pending  Disposition:  Pending  Hypertension  Home BP meds: amlodipine - olmesartan ; metoprolol  Current BP meds: amlodipine ; metoprolol ; irbesartan ; prn labetalol  Stable . SBP goal < 160 . Long-term BP goal normotensive  Hyperlipidemia  Home Lipid lowering medication: none   LDL pending goal < 70  Current lipid lowering medication: none (statin contraindicated with  ICH)  Continue statin at discharge  Other Stroke Risk Factors  Advanced age  ETOH use, advised to drink no more than 1 alcoholic beverage per day.  Obesity, Body mass index is 36.05 kg/m., recommend weight loss, diet and exercise as appropriate   Other Active Problems  Code status - Full code  Anemia - Hgb - 10.5 CKD - stage  3a - creatinine   Mild bradycardia - 50's  Hospital day # 0 She presented with slurred speech headache and right hemiparesis secondary to left frontal subcortical hemorrhage exact etiology indeterminate.  Recommend close observation in the ICU with strict blood pressure control with systolic goal below 409 for 24 hours and then below 160.  Close neurological monitoring.  Check MRI scan of the brain with contrast to rule out any underlying vascular lesion or metastasis. This patient is critically ill and at significant risk of neurological worsening, death and care requires constant monitoring  of vital signs, hemodynamics,respiratory and cardiac monitoring, extensive review of multiple databases, frequent neurological assessment, discussion with family, other specialists and medical decision making of high complexity.I have made any additions or clarifications directly to the above note.This critical care time does not reflect procedure time, or teaching time or supervisory time of PA/NP/Med Resident etc but could involve care discussion time.  I spent 30 minutes of neurocritical care time  in the care of  this patient.    Antony Contras, MD  To contact Stroke Continuity provider, please refer to [email or web address] After hours, contact General Neurology

## 2020-01-27 NOTE — Progress Notes (Signed)
PT Cancellation Note  Patient Details Name: Yesenia Bishop MRN: [id number] DOB: 04-23-1951   Cancelled Treatment:    Reason Eval/Treat Not Completed: Active bedrest order Pt with active bedrest orders. Will follow up as activity orders are updated and as schedule allows.   Gloriann Loan, SPT  Acute Rehabilitation Services  Office: [phone]  01/27/2020, 10:55 AM

## 2020-01-27 NOTE — H&P (Signed)
Neurology H&P  CC: Slurred speech  History is obtained from: Patient  HPI: Yesenia Bishop is a 69 y.o. female with a history of hypertension who presents with slurred speech that started on awakening Sunday morning.  She states that she also had some headache that started this morning.  Due to this she sought care at her PCP who sent her for an MRI.  This revealed a hemorrhagic lesion and she was therefore referred to the emergency department.  She denies extremity weakness or numbness, difficulty walking, double vision, or other symptoms.  She takes aspirin but no other blood thinners.   LKW: 8/29 prior to bed tpa given?: No, ICH IR Thrombectomy? No, ICH Modified Rankin Scale: 0-Completely asymptomatic and back to baseline post- stroke NIHSS: 4(face, arm, leg, dysarthria)   ROS: A complete ROS was performed and is negative except as noted in the HPI.   Past Medical History:  Diagnosis Date  . GERD (gastroesophageal reflux disease)   . HTN (hypertension)   . Mixed hyperlipidemia 08/28/2018     Family History  Problem Relation Age of Onset  . Diabetes Father   . Heart disease Father   . Hyperlipidemia Father   . Hypertension Father   . Varicose Veins Father   . Hypertension Brother      Social History:  reports that she has never smoked. She has never used smokeless tobacco. She reports current alcohol use of about 1.0 standard drink of alcohol per week. She reports that she does not use drugs.   Prior to Admission medications   Medication Sig Start Date End Date Taking? Authorizing Provider  amLODipine-olmesartan (AZOR) 10-40 MG per tablet Take 1 tablet by mouth daily.   Yes [provider]  aspirin 81 MG tablet Take 81 mg by mouth at bedtime.    Yes [provider]  Calcium Carbonate-Vitamin D (CALTRATE 600+D PO) Take 1 tablet by mouth daily.   Yes [provider]  Carboxymethylcellul-Glycerin (LUBRICATING EYE DROPS OP) Place 1 drop into both  eyes daily as needed (dry eyes).   Yes [provider]  Garlic 3532 MG CAPS Take 1,000 mg by mouth daily.   Yes [provider]  metoprolol tartrate (LOPRESSOR) 100 MG tablet Take 50 mg by mouth 2 (two) times daily.   Yes [provider]  nitroGLYCERIN (NITROSTAT) 0.4 MG SL tablet Place 1 tablet (0.4 mg total) under the tongue every 5 (five) minutes x 3 doses as needed for chest pain. 08/28/18  Yes Adrian Prows, MD  omeprazole (PRILOSEC) 40 MG capsule Take 40 mg by mouth in the morning and at bedtime.    Yes [provider]     Exam: Current vital signs: BP (!) 143/82   Pulse 63   Temp 97.6 F (36.4 C) (Oral)   Resp 16   Ht 5\' 4"  (1.626 m)   Wt 95.3 kg   SpO2 99%   BMI 36.05 kg/m    Physical Exam  Constitutional: Appears well-developed and well-nourished.  Psych: Affect appropriate to situation Eyes: No scleral injection HENT: No OP obstrucion Head: Normocephalic.  Cardiovascular: Normal rate and regular rhythm.  Respiratory: Effort normal and breath sounds normal to anterior ascultation GI: Soft.  No distension. There is no tenderness.  Skin: WDI  Neuro: Mental Status: Patient is awake, alert, oriented to person, place, month, year, and situation. Patient is able to give a clear and coherent history. No signs of aphasia or neglect Cranial Nerves: II: Visual Fields  are full. Pupils are equal, round, and reactive to light.   III,IV, VI: EOMI without ptosis or diplopia.  V: Facial sensation is symmetric to temperature VII: Facial movement with right facial weakness.  VIII: hearing is intact to voice X: Uvula is midline and palate elevates symmetrically XI: Shoulder shrug is symmetric. XII: tongue is midline without atrophy or fasciculations.  Motor: Tone is normal. Bulk is normal. 5/5 strength was present on the left, 4+/5 in the righ tarm and leg, mild drift.  Sensory: Sensation is symmetric to light touch and temperature in the arms  and legs. Cerebellar: FNF and HKS are intact bilaterally   I have reviewed labs in epic and the pertinent results are: Cr 1.18  I have reviewed the images obtained:Small cortical IPH on the left with mild edema  Primary Diagnosis:  Nontraumatic intracerebral hemorrhage in hemisphere, cortical  Secondary Diagnosis: Cerebral edema and Essential (primary) hypertension   Impression: 69 year old female with small intraparenchymal hematoma.  Possibilities include ischemic stroke with hemorrhagic conversion, amyloid (though no other evidence to suggest this), less likely hypertensive or tumor.  I will do an MRI of the brain with contrast to look for potential neoplastic origin.  She is already very nearly less than 140, and given that she has been symptomatic for greater than 36 hours, I think that we can use a less aggressive timeframe for control, will avoid IV drips. Will admit to progressive level.   Also, will get ischemic stroke workup as well.   Plan: 1) Admit to Progressive unit 2) no antiplatelets or anticoagulants 3) Will use IV labetalol pushes to target SBP < 140 4) Frequent neuro checks 5) If symptoms worsen or there is decreased mental status, repeat stat head CT 6) PT,OT,ST 7) lipids, a1c, MRA head and neck, echo  Roland Rack, MD [facility] [phone]  If 7pm- 7am, please page neurology on call as listed in [place].

## 2020-01-28 ENCOUNTER — Inpatient Hospital Stay (HOSPITAL_COMMUNITY): Payer: Medicare Other

## 2020-01-28 DIAGNOSIS — R001 Bradycardia, unspecified: Secondary | ICD-10-CM | POA: Diagnosis present

## 2020-01-28 DIAGNOSIS — N183 Chronic kidney disease, stage 3 unspecified: Secondary | ICD-10-CM | POA: Diagnosis present

## 2020-01-28 DIAGNOSIS — E669 Obesity, unspecified: Secondary | ICD-10-CM

## 2020-01-28 LAB — LIPID PANEL
Cholesterol: 176 mg/dL (ref 0–200)
HDL: 62 mg/dL (ref >40)
LDL Cholesterol: 97 mg/dL (ref 0–99)
Total CHOL/HDL Ratio: 2.8 RATIO
Triglycerides: 86 mg/dL (ref <150)
VLDL: 17 mg/dL (ref 0–40)

## 2020-01-28 IMAGING — CT CT ABDOMEN W/O CM
2 of 4 series · 15 of 46 positions shown, 17 images · non-contrast
Comparison: [DATE].

CLINICAL DATA: Adrenal nodule.

EXAM:
CT ABDOMEN WITHOUT CONTRAST
TECHNIQUE: Multidetector CT imaging of the abdomen was performed following the
standard protocol without IV contrast.

[Series 3: w/o abdomen 3.0 i30f 2 · axial · non-contrast · 0.64mm/px · z∈[+845,+1112]mm · 12 of 107 slices shown, 14 images]
[im 9/107  soft-tissue]
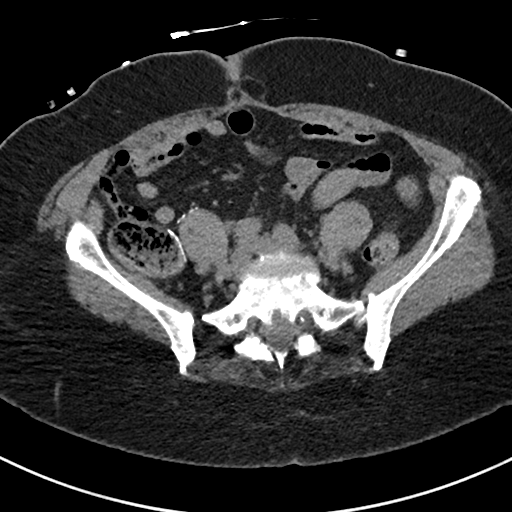
[im 9/107  bone]
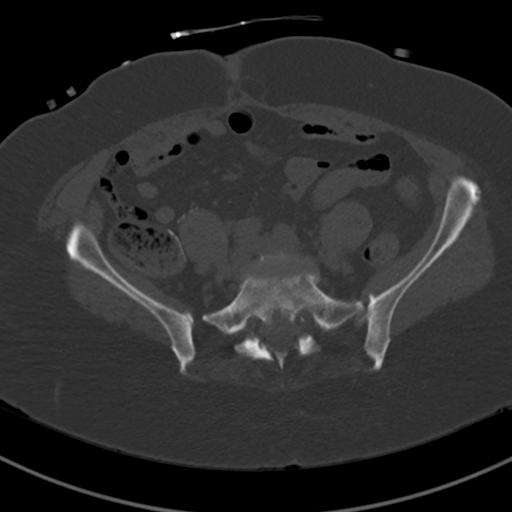
[im 17/107  soft-tissue]
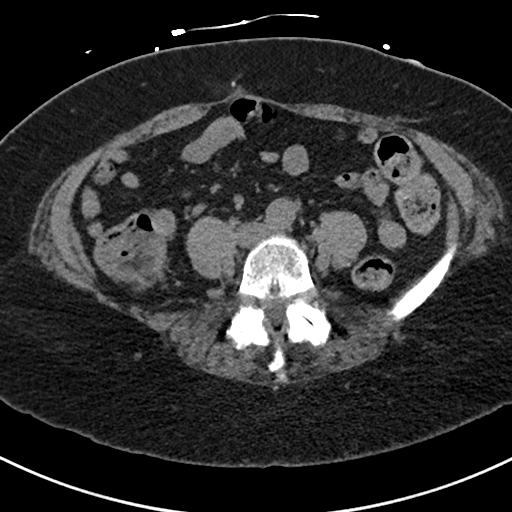
[im 25/107  soft-tissue]
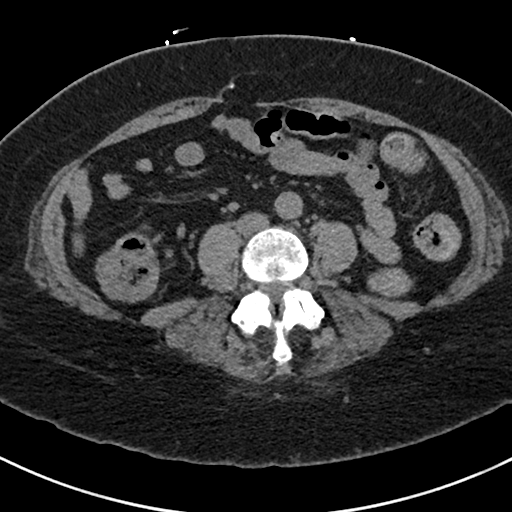
[im 33/107  soft-tissue]
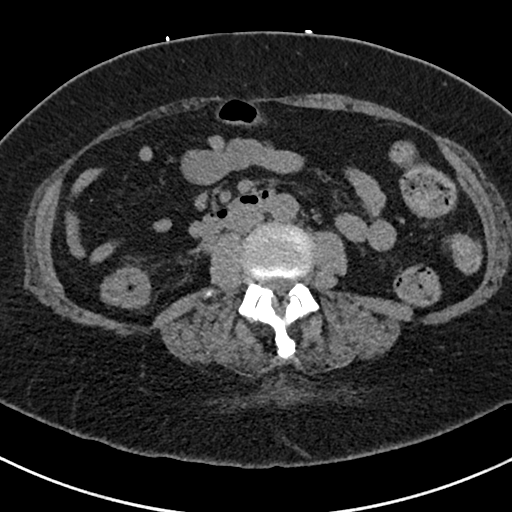
[im 41/107  soft-tissue]
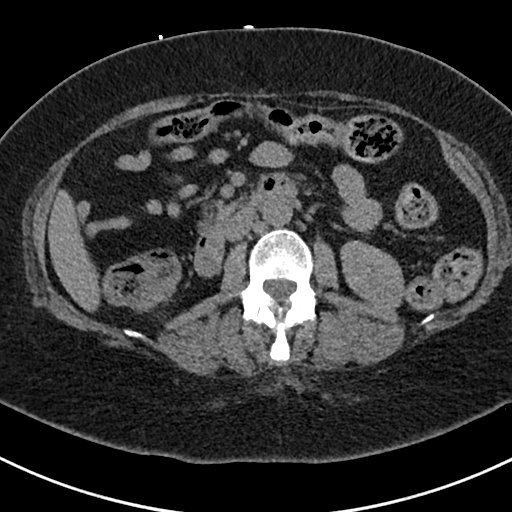
[im 49/107  soft-tissue]
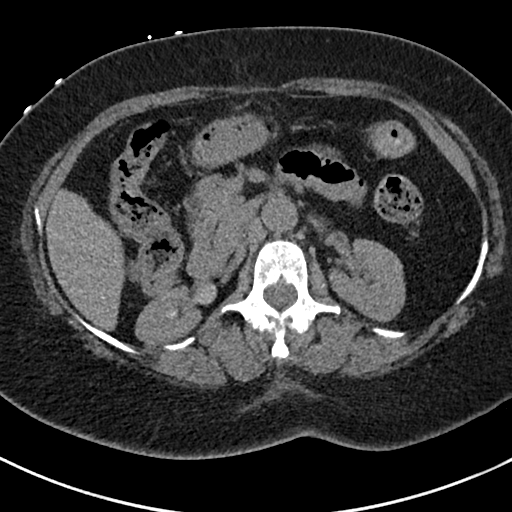
[im 58/107  soft-tissue]
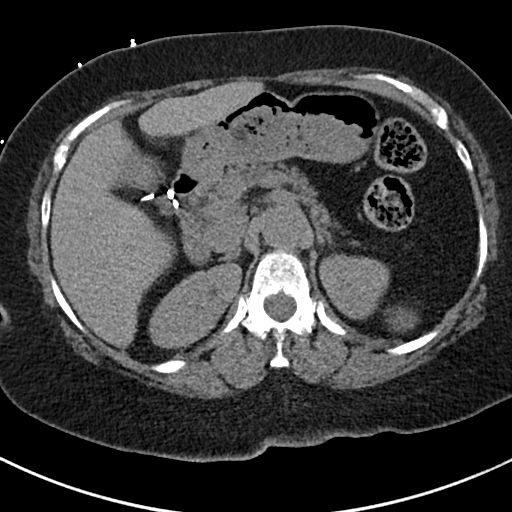
[im 66/107  soft-tissue]
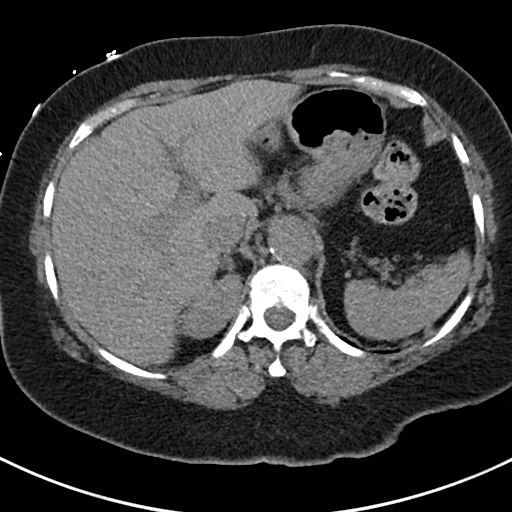
[im 74/107  soft-tissue]
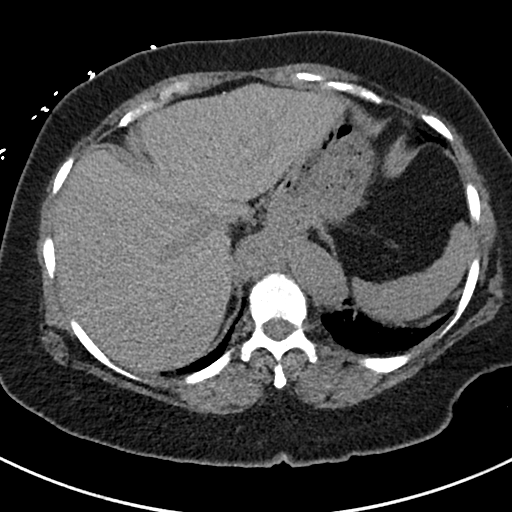
[im 74/107  bone]
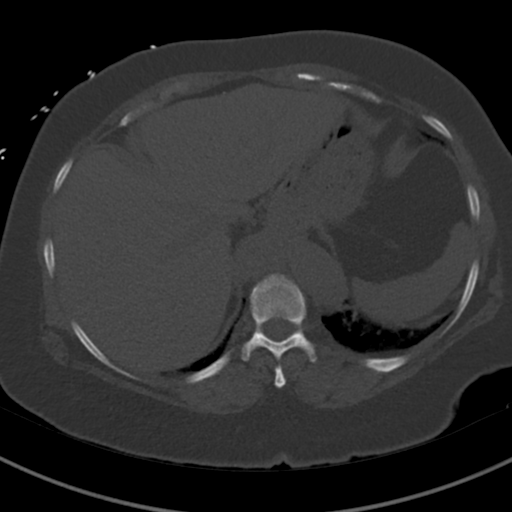
[im 82/107  soft-tissue]
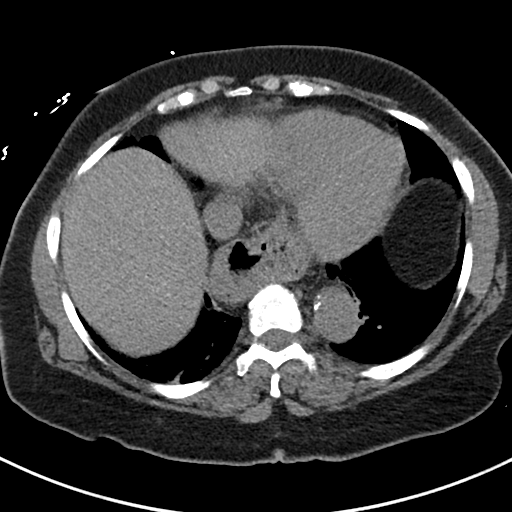
[im 90/107  soft-tissue]
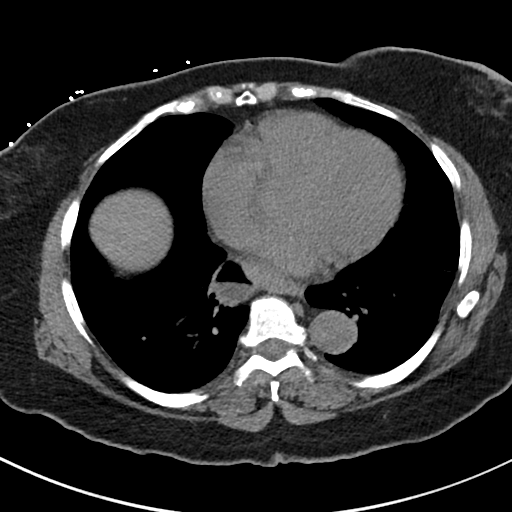
[im 98/107  soft-tissue]
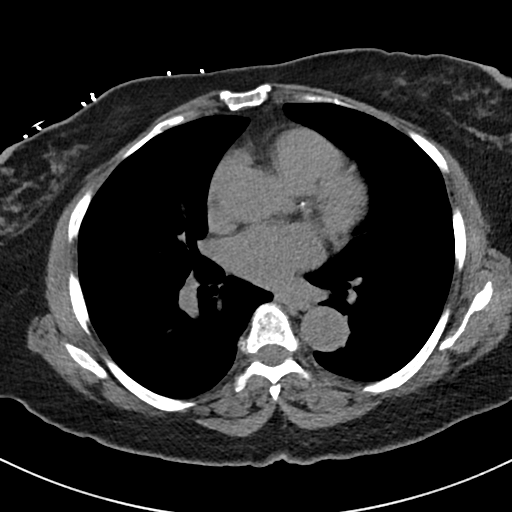

[Series 6: coronal · coronal · 0.62mm/px · 3 of 98 slices shown]
[im 33/98  soft-tissue]
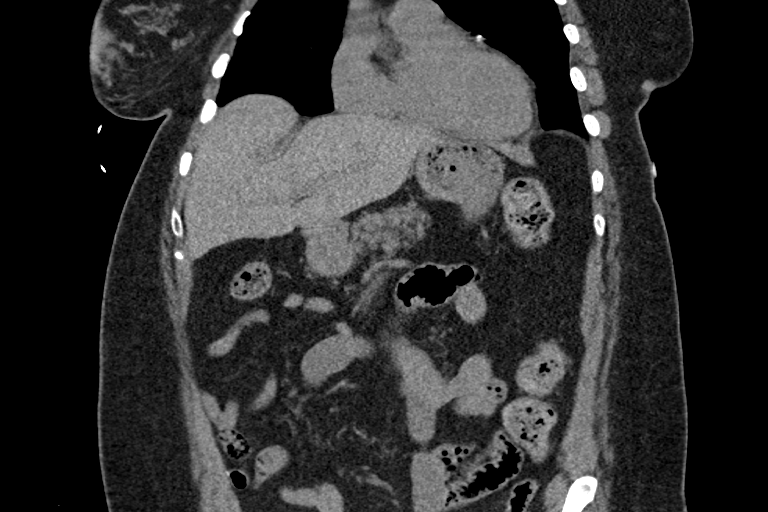
[im 44/98  soft-tissue]
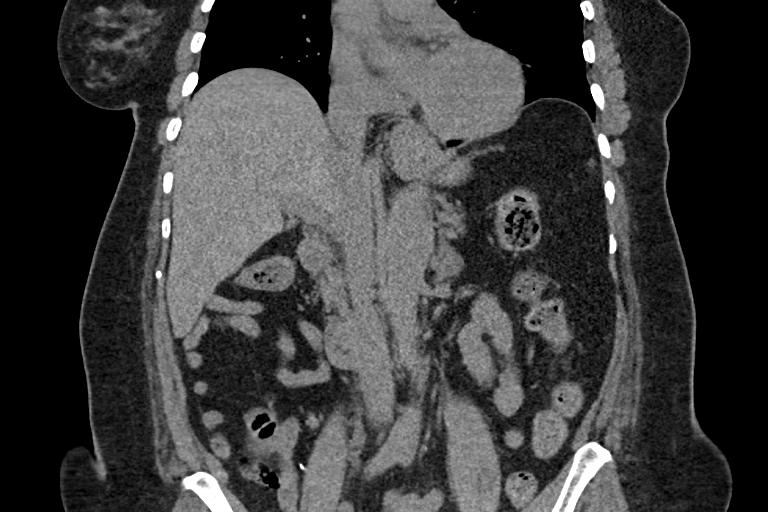
[im 54/98  soft-tissue]
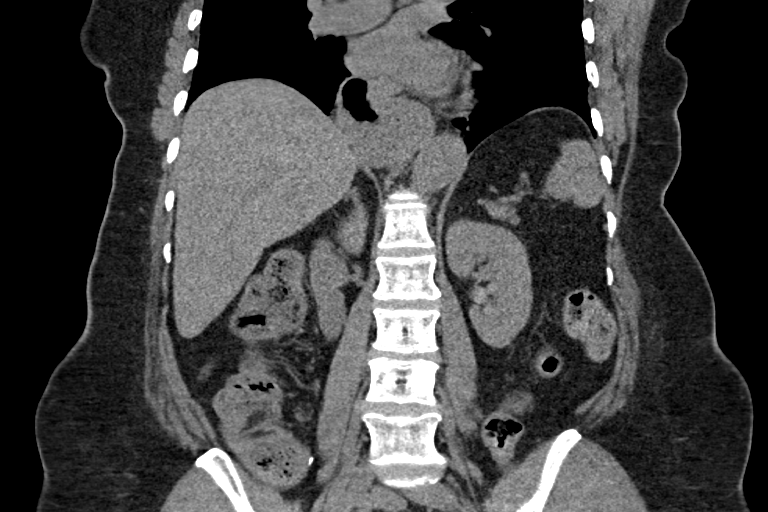

[15 of 46 positions shown; findings below may reference images not displayed]

FINDINGS: Lower chest: Mild volume are otherwise clear loss in the lower
lobes. Lung bases are otherwise clear. Heart is at the upper limits
of normal in size. No pericardial or pleural effusion. Moderate
hiatal hernia.

Hepatobiliary: Liver is unremarkable. Cholecystectomy. No biliary
ductal dilatation.

Pancreas: Negative.

Spleen: Negative.

Adrenals/Urinary Tract: Right adrenal gland is unremarkable. 1.6 cm
left adrenal nodule measures 17-20 Hounsfield units. Excretion of
residual IV contrast from study performed yesterday. Kidneys are
otherwise unremarkable.

Stomach/Bowel: Moderate hiatal hernia. Stomach and visualized
portions of the small bowel and colon are otherwise unremarkable.

Vascular/Lymphatic: Atherosclerotic calcification of the aorta
without aneurysm. No pathologically enlarged lymph nodes.

Other: Midline supraumbilical and umbilical hernias contain fat. No
free fluid.

Musculoskeletal: None.
IMPRESSION: 1. Left adrenal nodule has borderline attenuation values but is
likely a lipid poor adenoma in the absence of known malignancy. If
further evaluation is desired, MR abdomen without and with contrast
is recommended.
2. Moderate hiatal hernia.
3.  Aortic atherosclerosis ([XA]-[XA]).

## 2020-01-28 MED ORDER — PANTOPRAZOLE SODIUM 40 MG PO TBEC: 40.0000 mg | DELAYED_RELEASE_TABLET | Freq: Every day | ORAL | Status: DC

## 2020-01-28 MED ORDER — SENNOSIDES-DOCUSATE SODIUM 8.6-50 MG PO TABS: 1.0000 | ORAL_TABLET | Freq: Every evening | ORAL | Status: DC | PRN

## 2020-01-28 NOTE — Evaluation (Signed)
Occupational Therapy Evaluation Patient Details Name: Yesenia Bishop MRN: [id number] DOB: 09-18-1950 Today's Date: 01/28/2020    History of Present Illness Yesenia Bishop is a 69 y.o. female with a history of hypertension who presents with slurred speech that started on awakening Sunday morning. PMH: GERD, HTN   Clinical Impression   PTA patient independent and driving. Admitted for above and presenting near baseline level for ADLs, functional mobility, transfers in room; no deficits noted in cognition, weakness or sensation.  Patient presenting with slurred speech and reports difficulty with word finding at times, requires cueing to slow down speech to clearly express herself.  She will have support of daughter at home, providing 24/7 support.  Based on performance today, no further acute OT needs identified and OT will sign off. Thank you for this referral.     Follow Up Recommendations  No OT follow up    Equipment Recommendations  None recommended by OT    Recommendations for Other Services       Precautions / Restrictions Precautions Precautions: Fall Restrictions Weight Bearing Restrictions: No      Mobility Bed Mobility Overal bed mobility: Modified Independent             General bed mobility comments: hob elevated, no physical assist or use of bed rail  Transfers Overall transfer level: Modified independent Equipment used: None             General transfer comment: no assist required from recliner     Balance Overall balance assessment: Mild deficits observed, not formally tested                                         ADL either performed or assessed with clinical judgement   ADL Overall ADL's : Independent                                       General ADL Comments: pt at independent level for LB dressing, grooming at sink and simulated tub transfers     Vision Baseline Vision/History: Wears glasses Wears  Glasses: Reading only Patient Visual Report: No change from baseline Vision Assessment?: No apparent visual deficits     Perception     Praxis      Pertinent Vitals/Pain Pain Assessment: No/denies pain     Hand Dominance Right   Extremity/Trunk Assessment Upper Extremity Assessment Upper Extremity Assessment: Overall WFL for tasks assessed   Lower Extremity Assessment Lower Extremity Assessment: Defer to PT evaluation   Cervical / Trunk Assessment Cervical / Trunk Assessment: Normal   Communication Communication Communication: Expressive difficulties (slurred speech)   Cognition Arousal/Alertness: Awake/alert Behavior During Therapy: WFL for tasks assessed/performed Overall Cognitive Status: Within Functional Limits for tasks assessed                                     General Comments  pt demonstrates ability to retrieve items from closet with independence     Exercises     Shoulder Instructions      Home Living Family/patient expects to be discharged to:: Private residence Living Arrangements: Children (lives with son but going home with daughter) Available Help at Discharge: Family;Available 24 hours/day (dtr works from home)  Type of Home: House Home Access: Level entry     Home Layout: Two level;Bed/bath upstairs Alternate Level Stairs-Number of Steps: 12 Alternate Level Stairs-Rails: Right Bathroom Shower/Tub: Tub/shower unit;Walk-in shower   Bathroom Toilet: Handicapped height     Home Equipment: None          Prior Functioning/Environment Level of Independence: Independent        Comments: drives, cooks, cleans        OT Problem List:        OT Treatment/Interventions:      OT Goals(Current goals can be found in the care plan section) Acute Rehab OT Goals Patient Stated Goal: home OT Goal Formulation: With patient  OT Frequency:     Barriers to D/C:            Co-evaluation              AM-PAC OT "6  Clicks" Daily Activity     Outcome Measure Help from another person eating meals?: None Help from another person taking care of personal grooming?: None Help from another person toileting, which includes using toliet, bedpan, or urinal?: None Help from another person bathing (including washing, rinsing, drying)?: None Help from another person to put on and taking off regular upper body clothing?: None Help from another person to put on and taking off regular lower body clothing?: None 6 Click Score: 24   End of Session Nurse Communication: Mobility status  Activity Tolerance: Patient tolerated treatment well Patient left: in chair;with call bell/phone within reach  OT Visit Diagnosis: Unsteadiness on feet (R26.81)                Time: 6256-3893 OT Time Calculation (min): 12 min Charges:  OT General Charges $OT Visit: 1 Visit OT Evaluation $OT Eval Low Complexity: 1 Low  Yesenia Bishop, OT Acute Rehabilitation Services Pager [phone] Office [phone]   Yesenia Bishop 01/28/2020, 12:33 PM

## 2020-01-28 NOTE — TOC Transition Note (Signed)
Transition of Care [facility]) - CM/SW Discharge Note   Patient Details  Name: Yesenia Bishop MRN: [id number] Date of Birth: 1950-06-16  [facility] CM/SW Contact:  Pollie Friar, RN Phone Number: 01/28/2020, 4:03 PM   Clinical Narrative:    Pt is discharging home tonight to her home with her son. Tomorrow she is going to go and stay with her daughter in [place]. She prefers to start her outpatient therapy in [place] called [facility] and they are able to accept her with orders. CM has sent them the information needed. Information for pt on the AVS. Pt has transportation home.   Final next level of care: OP Rehab Barriers to Discharge: No Barriers Identified   Patient Goals and CMS Choice        Discharge Placement                       Discharge Plan and Services                                     Social Determinants of Health (SDOH) Interventions     Readmission Risk Interventions No flowsheet data found.

## 2020-01-28 NOTE — Evaluation (Signed)
Physical Therapy Evaluation and Discharge Patient Details Name: Yesenia Bishop MRN: [id number] DOB: Feb 01, 1951 Today's Date: 01/28/2020   History of Present Illness  Yesenia Bishop is a 69 y.o. female with a history of hypertension who presents with slurred speech that started on awakening Sunday morning. PMH: GERD, HTN    Clinical Impression  Pt admitted with above. Pt functioning near baseline. Pt with noted slurred speech with word finding difficulty. Pt able to ambulate with min guard assist due to first time up. No AD required. Pt returning home with daughter who can provide 24/7 assist. Pt with no further acute PT services needed at this time. PT SIGNING OFF. Please re-consult if needed in future.    Follow Up Recommendations No PT follow up;Supervision/Assistance - 24 hour    Equipment Recommendations  None recommended by PT    Recommendations for Other Services       Precautions / Restrictions Precautions Precautions: Fall Restrictions Weight Bearing Restrictions: No      Mobility  Bed Mobility Overal bed mobility: Modified Independent             General bed mobility comments: hob elevated, no physical assist or use of bed rail  Transfers Overall transfer level: Modified independent Equipment used: None             General transfer comment: no difficulty, good use of hands to push up from bed  Ambulation/Gait Ambulation/Gait assistance: Min guard Gait Distance (Feet): 250 Feet Assistive device: None Gait Pattern/deviations: Step-through pattern;Decreased stride length Gait velocity: mildly decreased compared to normal Gait velocity interpretation: 1.31 - 2.62 ft/sec, indicative of limited community ambulator General Gait Details: near baseline, pt feels a little guarded due to being in the bed  Stairs Stairs: Yes Stairs assistance: Min guard Stair Management: One rail Right;Alternating pattern Number of Stairs: 12 General stair comments: pt  steady with good technique  Wheelchair Mobility    Modified Rankin (Stroke Patients Only) Modified Rankin (Stroke Patients Only) Pre-Morbid Rankin Score: No symptoms Modified Rankin: No significant disability     Balance Overall balance assessment: Mild deficits observed, not formally tested                                           Pertinent Vitals/Pain Pain Assessment: No/denies pain    Home Living Family/patient expects to be discharged to:: Private residence Living Arrangements: Children (lives with son but going home with daughter) Available Help at Discharge: Family;Available 24 hours/day (dtr works from home) Type of Home: House Home Access: Level entry     [place]: Two level;Bed/bath [place]: None      Prior Function Level of Independence: Independent         Comments: drives, cooks, Development worker, international aid Dominance   Dominant Hand: Right    Extremity/Trunk Assessment   Upper Extremity Assessment Upper Extremity Assessment: Overall WFL for tasks assessed    Lower Extremity Assessment Lower Extremity Assessment: Overall WFL for tasks assessed    Cervical / Trunk Assessment Cervical / Trunk Assessment: Normal  Communication   Communication: Expressive difficulties (slurred speech)  Cognition Arousal/Alertness: Awake/alert Behavior During Therapy: WFL for tasks assessed/performed Overall Cognitive Status: Within Functional Limits for tasks assessed  General Comments General comments (skin integrity, edema, etc.): VSS    Exercises     Assessment/Plan    PT Assessment Patent does not need any further PT services  PT Problem List         PT Treatment Interventions      PT Goals (Current goals can be found in the Care Plan section)  Acute Rehab PT Goals Patient Stated Goal: home PT Goal Formulation: All assessment and education complete, DC  therapy    Frequency     Barriers to discharge        Co-evaluation               AM-PAC PT "6 Clicks" Mobility  Outcome Measure Help needed turning from your back to your side while in a flat bed without using bedrails?: None Help needed moving from lying on your back to sitting on the side of a flat bed without using bedrails?: None Help needed moving to and from a bed to a chair (including a wheelchair)?: None Help needed standing up from a chair using your arms (e.g., wheelchair or bedside chair)?: None Help needed to walk in hospital room?: None Help needed climbing 3-5 steps with a railing? : A Little 6 Click Score: 23    End of Session Equipment Utilized During Treatment: Gait belt Activity Tolerance: Patient tolerated treatment well Patient left: in chair;with call bell/phone within reach (with OT) Nurse Communication: Mobility status PT Visit Diagnosis: Unsteadiness on feet (R26.81)    Time: 1829-9371 PT Time Calculation (min) (ACUTE ONLY): 13 min   Charges:   PT Evaluation $PT Eval Low Complexity: 1 Low          Kittie Plater, PT, DPT Acute Rehabilitation Services Pager #: [phone] Office #: [phone]   Berline Lopes 01/28/2020, 12:21 PM

## 2020-01-28 NOTE — Progress Notes (Signed)
PHARMACIST - PHYSICIAN COMMUNICATION  DR:   Leonie Man  CONCERNING: IV to Oral Route Change Policy  RECOMMENDATION: This patient is receiving protonix by the intravenous route.  Based on criteria approved by the Pharmacy and Therapeutics Committee, the intravenous medication(s) is/are being converted to the equivalent oral dose form(s).   DESCRIPTION: These criteria include:  The patient is eating (either orally or via tube) and/or has been taking other orally administered medications for a least 24 hours  The patient has no evidence of active gastrointestinal bleeding or impaired GI absorption (gastrectomy, short bowel, patient on TNA or NPO).  If you have questions about this conversion, please contact the Pharmacy Department  []   [phone] )  [facility] []   [phone] )  [facility] [x]   [phone] )  [facility] []   [phone] )  [facility] []   [phone] )  Enola A. Levada Dy, PharmD, BCPS, FNKF Clinical Pharmacist [place] Please utilize Amion for appropriate phone number to reach the unit pharmacist ([place])   01/28/2020 10:11 AM

## 2020-01-28 NOTE — Evaluation (Signed)
Speech Language Pathology Evaluation Patient Details Name: Yesenia Bishop MRN: [id number] DOB: 1951/04/04 Today's Date: 01/28/2020 Time: 0350-0938 SLP Time Calculation (min) (ACUTE ONLY): 23 min  Problem List:  Patient Active Problem List   Diagnosis Date Noted  . CKD (chronic kidney disease), stage III 01/28/2020  . Bradycardia 01/28/2020  . Obesity (BMI 30-39.9) 01/28/2020  . 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 Constipation 07/28/2019  . GERD (gastroesophageal reflux disease) 07/28/2019  . Dysphagia 07/28/2019  . Nausea with vomiting 07/28/2019  . LUQ pain 07/28/2019  . Mixed hyperlipidemia 08/28/2018  . NSTEMI (non-ST elevated myocardial infarction) ([place]) 08/27/2018  . Essential hypertension 08/27/2018  . Non-ST elevation (NSTEMI) myocardial infarction ([place]) 08/27/2018  . Varicose veins of leg with complications 18/29/9371   Past Medical History:  Past Medical History:  Diagnosis Date  . GERD (gastroesophageal reflux disease)   . HTN (hypertension)   . Mixed hyperlipidemia 08/28/2018   Past Surgical History:  Past Surgical History:  Procedure Laterality Date  . 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 APPENDECTOMY    . BIOPSY  08/13/2019   Procedure: BIOPSY;  Surgeon: Daneil Dolin, MD;  Location: [facility];  Service: Endoscopy;;  . CHOLECYSTECTOMY    . COLONOSCOPY  12/2010   Dr. Sherrlyn Hock: Excellent..  Unable to navigate proximal to the hepatic flexure.  Otherwise visualized colon was normal.  Consider propofol, exam following up with barium enema.  . ESOPHAGOGASTRODUODENOSCOPY N/A 08/13/2019   Rourk: Esophagus normal, status post dilation for history of dysphagia.  Large hiatal hernia.  Gastric polyps removed, hyperplastic.  Marland Kitchen LEFT HEART CATH AND CORONARY ANGIOGRAPHY N/A 08/28/2018   Procedure: LEFT HEART CATH AND CORONARY ANGIOGRAPHY;  Surgeon: Adrian Prows, MD;  Location: [facility];  Service:  Cardiovascular;  Laterality: N/A;  . Venia Minks DILATION N/A 08/13/2019   Procedure: Venia Minks DILATION;  Surgeon: Daneil Dolin, MD;  Location: [facility];  Service: Endoscopy;  Laterality: N/A;  . SHOULDER SURGERY Right   . TUBAL LIGATION     HPI:  Yesenia Bishop is a 69 y.o. female with a history of hypertension who presents with slurred speech that started on awakening Sunday morning. 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 Subtest pt scored lower in was memory mental calculation and divergent naming. She exhibits moderate dysarthria marked by phoneme imprecision, increased rate of speech. Recommend continued therapy as pt discharging today or tomorrow. Recommend outpatient ST however she reports she will not have transportation and recommend home health.     SLP Assessment  SLP Recommendation/Assessment: All further Speech Lanaguage Pathology  needs can be addressed in the next venue of care (pt discharging today or tomorrow) SLP Visit Diagnosis: Dysarthria and anarthria (R47.1);Cognitive communication deficit (R41.841)    Follow Up Recommendations  Outpatient SLP;Home health SLP    Frequency and Duration           SLP Evaluation Cognition  Overall Cognitive Status: Impaired/Different from baseline Arousal/Alertness: Awake/alert Orientation Level: Oriented X4 Attention: Sustained Sustained Attention: Appears intact Memory: Impaired Memory Impairment: Retrieval deficit Awareness: Appears intact Problem Solving: Impaired Problem Solving Impairment: Verbal complex Safety/Judgment: Appears intact       Comprehension  Auditory  Comprehension Overall Auditory Comprehension: Appears  within functional limits for tasks assessed Visual Recognition/Discrimination Discrimination: Not tested    Expression Expression Primary Mode of Expression: Verbal Verbal Expression Overall Verbal Expression: Appears within functional limits for tasks assessed Written Expression Dominant Hand: Right Written Expression: Not tested   Oral / Motor  Oral Motor/Sensory Function Overall Oral Motor/Sensory Function: Mild impairment Facial ROM: Reduced left;Suspected CN VII (facial) dysfunction Facial Symmetry: Abnormal symmetry left;Suspected CN VII (facial) dysfunction Facial Strength: Reduced left;Suspected CN VII (facial) dysfunction Facial Sensation: Reduced left;Suspected CN V (Trigeminal) dysfunction Lingual ROM: Within Functional Limits Lingual Symmetry: Within Functional Limits Motor Speech Overall Motor Speech: Impaired Respiration: Within functional limits Phonation: Normal Resonance: Within functional limits Articulation: Impaired Level of Impairment: Sentence Intelligibility: Intelligibility reduced Word: 75-100% accurate Phrase: 75-100% accurate Sentence: 75-100% accurate Conversation: 75-100% accurate Motor Planning: Witnin functional limits   GO                    Houston Siren 01/28/2020, 4:31 PM  Orbie Pyo Yesenia Bishop M.Ed Risk analyst [phone] Office [phone]

## 2020-01-28 NOTE — Plan of Care (Signed)
  Problem: Education: Goal: Knowledge of General Education information will improve Description Including pain rating scale, medication(s)/side effects and non-pharmacologic comfort measures Outcome: Progressing   Problem: Health Behavior/Discharge Planning: Goal: Ability to manage health-related needs will improve Outcome: Progressing   Problem: Clinical Measurements: Goal: Ability to maintain clinical measurements within normal limits will improve Outcome: Progressing Goal: Will remain free from infection Outcome: Progressing Goal: Diagnostic test results will improve Outcome: Progressing Goal: Respiratory complications will improve Outcome: Progressing Goal: Cardiovascular complication will be avoided Outcome: Progressing   Problem: Activity: Goal: Risk for activity intolerance will decrease Outcome: Progressing   Problem: Nutrition: Goal: Adequate nutrition will be maintained Outcome: Progressing   Problem: Coping: Goal: Level of anxiety will decrease Outcome: Progressing   Problem: Elimination: Goal: Will not experience complications related to bowel motility Outcome: Progressing Goal: Will not experience complications related to urinary retention Outcome: Progressing   Problem: Pain Managment: Goal: General experience of comfort will improve Outcome: Progressing   Problem: Safety: Goal: Ability to remain free from injury will improve Outcome: Progressing   Problem: Skin Integrity: Goal: Risk for impaired skin integrity will decrease Outcome: Progressing   Problem: Education: Goal: Knowledge of disease or condition will improve Outcome: Progressing Goal: Knowledge of secondary prevention will improve Outcome: Progressing Goal: Knowledge of patient specific risk factors addressed and post discharge goals established will improve Outcome: Progressing Goal: Individualized Educational Video(s) Outcome: Progressing   Problem: Coping: Goal: Will verbalize  positive feelings about self Outcome: Progressing Goal: Will identify appropriate support needs Outcome: Progressing   Problem: Health Behavior/Discharge Planning: Goal: Ability to manage health-related needs will improve Outcome: Progressing   

## 2020-01-28 NOTE — Progress Notes (Signed)
Pt discharged at this time.  Has all belongings with her, including her cell phone.  She verbalizes understanding of all discharge instructions including the need to not take ASA products.  No complaints.  Picked up by son.

## 2020-01-28 NOTE — Progress Notes (Signed)
Returned from [facility].

## 2020-01-28 NOTE — Discharge Summary (Addendum)
Stroke Discharge Summary  Patient ID: Yesenia Bishop   MRN: [id number]      DOB: July 17, 1950  Date of Admission: 01/27/2020 Date of Discharge: 01/28/2020  Attending Physician:  Garvin Fila, MD, Stroke MD Consultant(s):    None  Patient's PCP:  Monico Blitz, MD  DISCHARGE DIAGNOSIS:  Principal Problem:   Cerebral hemorrhage ([place]) - L frontal, source indeterminate Active Problems:   Essential hypertension   Mixed hyperlipidemia   CKD (chronic kidney disease), stage III   Bradycardia   Obesity (BMI 30-39.9)   Allergies as of 01/28/2020   No Known Allergies      Medication List     STOP taking these medications    aspirin 81 MG tablet       TAKE these medications    Azor 10-40 MG tablet Generic drug: amLODipine-olmesartan Take 1 tablet by mouth daily.   CALTRATE 600+D PO Take 1 tablet by mouth daily.   Garlic 9326 MG Caps Take 1,000 mg by mouth daily.   LUBRICATING EYE DROPS OP Place 1 drop into both eyes daily as needed (dry eyes).   metoprolol tartrate 100 MG tablet Commonly known as: LOPRESSOR Take 50 mg by mouth 2 (two) times daily.   nitroGLYCERIN 0.4 MG SL tablet Commonly known as: NITROSTAT Place 1 tablet (0.4 mg total) under the tongue every 5 (five) minutes x 3 doses as needed for chest pain.   omeprazole 40 MG capsule Commonly known as: PRILOSEC Take 40 mg by mouth in the morning and at bedtime.   senna-docusate 8.6-50 MG tablet Commonly known as: Senokot-S Take 1 tablet by mouth at bedtime as needed for mild constipation.        LABORATORY STUDIES CBC    Component Value Date/Time   WBC 8.4 01/27/2020 0125   RBC 4.27 01/27/2020 0125   HGB 10.5 (L) 01/27/2020 0125   HCT 34.3 (L) 01/27/2020 0125   PLT 226 01/27/2020 0125   MCV 80.3 01/27/2020 0125   MCH 24.6 (L) 01/27/2020 0125   MCHC 30.6 01/27/2020 0125   RDW 18.5 (H) 01/27/2020 0125   LYMPHSABS 2.2 01/27/2020 0125   MONOABS 0.7 01/27/2020 0125   EOSABS 0.1 01/27/2020  0125   BASOSABS 0.1 01/27/2020 0125   CMP    Component Value Date/Time   NA 139 01/27/2020 0125   K 3.7 01/27/2020 0125   CL 103 01/27/2020 0125   CO2 24 01/27/2020 0125   GLUCOSE 123 (H) 01/27/2020 0125   BUN 15 01/27/2020 0125   CREATININE 1.18 (H) 01/27/2020 0125   CREATININE 0.87 11/19/2019 1352   CALCIUM 10.1 01/27/2020 0125   PROT 7.8 01/27/2020 0125   ALBUMIN 3.9 01/27/2020 0125   AST 25 01/27/2020 0125   ALT 16 01/27/2020 0125   ALKPHOS 76 01/27/2020 0125   BILITOT <0.1 (L) 01/27/2020 0125   GFRNONAA 47 (L) 01/27/2020 0125   GFRAA 55 (L) 01/27/2020 0125   COAGS Lab Results  Component Value Date   INR 1.0 01/27/2020   INR 0.9 01/27/2020   Lipid Panel    Component Value Date/Time   CHOL 176 01/28/2020 0707   TRIG 86 01/28/2020 0707   HDL 62 01/28/2020 0707   CHOLHDL 2.8 01/28/2020 0707   VLDL 17 01/28/2020 0707   LDLCALC 97 01/28/2020 0707   HgbA1C  Lab Results  Component Value Date   HGBA1C 6.2 (H) 01/27/2020    SIGNIFICANT DIAGNOSTIC STUDIES CT HEAD WO CONTRAST  Result Date: 01/27/2020  CLINICAL DATA:  Intracranial hemorrhage EXAM: CT HEAD WITHOUT CONTRAST TECHNIQUE: Contiguous axial images were obtained from the base of the skull through the vertex without intravenous contrast. COMPARISON:  Brain MRI 01/26/2020 FINDINGS: Brain: There is an acute intraparenchymal hemorrhage in the left frontal operculum measuring 2 cm. There is mild adjacent edema. No midline shift or other mass effect. The brain is otherwise normal. Vascular: No hyperdense vessel or unexpected calcification. Skull: Normal Sinuses/Orbits: Normal Other: None IMPRESSION: 1. Acute intraparenchymal hemorrhage in the left frontal operculum measuring 2 cm with mild adjacent edema. 2. No midline shift or other mass effect. Electronically Signed   By: Ulyses Jarred M.D.   On: 01/27/2020 02:09   MR BRAIN WO CONTRAST  Addendum Date: 01/26/2020   ADDENDUM REPORT: 01/26/2020 17:34 ADDENDUM: These  results were called by telephone at the time of interpretation on 01/26/2020 at 5:33 pm to provider Dr. Jerene Bears , who verbally acknowledged these results. Electronically Signed   By: Kellie Simmering DO   On: 01/26/2020 17:34   Result Date: 01/26/2020 CLINICAL DATA:  Slurred speech. Additional history provided: Left-sided weakness and slurred speech for 2 days. EXAM: MRI HEAD WITHOUT CONTRAST TECHNIQUE: Multiplanar, multiecho pulse sequences of the brain and surrounding structures were obtained without intravenous contrast. COMPARISON:  No pertinent prior exams are available for comparison. FINDINGS: Brain: There is a 2.2 cm hemorrhagic focus within the left frontal operculum which appears centered within the subcortical white matter (for instance as seen on series 10, image 13 and series 13, image 38). There is corresponding SWI signal loss at this site. There is a small to moderate amount of surrounding edema. Susceptibility artifact from blood products at this site limits evaluation for associated restricted diffusion. Mild background scattered T2/FLAIR hyperintensity within the cerebral white matter is nonspecific, but likely reflect sequela of chronic small vessel ischemic disease. No extra-axial fluid collection. No midline shift. Partially empty sella turcica. Vascular: Expected proximal arterial flow voids. Skull and upper cervical spine: No focal marrow lesion. Sinuses/Orbits: Visualized orbits show no acute finding. Trace ethmoid sinus mucosal thickening. No significant mastoid effusion. IMPRESSION: 2.2 cm hemorrhagic lesion centered within the subcortical white matter of the left frontal operculum with surrounding small to moderate edema. Differential considerations include a parenchymal hemorrhage with associated edema, primary or metastatic hemorrhagic neoplasm or hemorrhagic conversion of a recent white matter infarct, among others. Noncontrast head CT is recommended to assess for any acute component  of hemorrhage. Additionally, contrast-enhanced brain MRI is recommended for further characterization. Background mild multifocal T2 hyperintensity within the cerebral white matter which is nonspecific, but likely reflects chronic small vessel ischemic disease. Trace ethmoid sinus mucosal thickening. Electronically Signed: By: Kellie Simmering DO On: 01/26/2020 17:27   MR BRAIN W CONTRAST  Result Date: 01/27/2020 CLINICAL DATA:  Parenchymal hemorrhage, follow-up. EXAM: MRI HEAD WITH CONTRAST TECHNIQUE: Multiplanar, multiecho pulse sequences of the brain and surrounding structures were obtained with intravenous contrast. CONTRAST:  66mL GADAVIST GADOBUTROL 1 MMOL/ML IV SOLN COMPARISON:  Noncontrast brain MRI 01/26/2020. Noncontrast head CT 01/27/2020. FINDINGS: Brain: A limited protocol brain MRI was performed as a follow-up to the noncontrast brain MRI of 01/26/2020. For the current examination, a pre contrast axial T1 weighted sequence was obtained as well as axial, coronal and sagittal T1 weighted postcontrast sequences and a postcontrast T2 weighted sequence. There is unchanged size of a 2 cm acute/early subacute parenchymal hemorrhage centered within the subcortical white matter of the left frontal operculum. Unchanged mild-to-moderate surrounding vasogenic  edema. No definite masslike or nodular enhancement is demonstrated at this site. There is adjacent vascular enhancement, which may be reactive. 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 No abnormal enhancement is identified elsewhere within the brain. Vascular: Expected enhancement within the proximal large arterial vessels and dural venous sinuses. Skull and upper cervical spine: No enhancing suspicious calvarial lesion. Sinuses/Orbits: No enhancing orbital lesion is demonstrated mild ethmoid sinus mucosal thickening. IMPRESSION: 2 cm acute/early  subacute parenchymal hemorrhage within the subcortical left frontal operculum, unchanged as compared to the head CT of 01/27/2020. There is adjacent vascular enhancement which may be reactive. No definite mass-like or nodular enhancement is demonstrated at this site on the current exam. However, short interval MRI follow-up is recommended once the hematoma involutes to further assess for an underlying lesion (i.e. primary or metastatic neoplasm). 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 Electronically Signed   By: Kellie Simmering DO   On: 01/27/2020 12:19   ECHOCARDIOGRAM COMPLETE  Result Date: 01/27/2020    ECHOCARDIOGRAM REPORT   Patient Name:   Yesenia Bishop Date of Exam: 01/27/2020 Medical Rec #:  [id number]        Height:       64.0 in Accession #:    [id number]       Weight:       210.0 lb Date of Birth:  1951/03/14         BSA:          1.997 m Patient Age:    70 years         BP:           122/82 mmHg Patient Gender: F                HR:           53 bpm. Exam Location:  Inpatient Procedure: 2D Echo, Cardiac Doppler and Color Doppler Indications:    Stroke 434.91 / I163.9  History:        Patient has prior history of Echocardiogram examinations, most                 recent 08/28/2018. Arrythmias:non-specific ST changes; Risk                 Factors:Hypertension, Dyslipidemia and Non-Smoker. GERD.  Sonographer:    Vickie Epley RDCS Referring Phys: [phone] MCNEILL P [place]  1. Left ventricular ejection fraction, by estimation, is 60 to 65%. The left ventricle has normal function. The left ventricle has no regional wall motion abnormalities. There is mild left ventricular hypertrophy. Left ventricular diastolic parameters are consistent with Grade I diastolic dysfunction (impaired relaxation).  2. Right ventricular systolic function is  normal. The right ventricular size is normal. There is normal pulmonary artery systolic pressure. The estimated right ventricular systolic pressure is 91.6 mmHg.  3. Right atrial size was mildly dilated.  4. The mitral valve is normal in structure. No evidence of mitral valve regurgitation. No evidence of mitral stenosis.  5. The aortic valve is tricuspid. Aortic valve regurgitation is not visualized. No aortic stenosis is present.  6. The inferior vena cava is normal in size with greater than 50% respiratory variability, suggesting right atrial pressure of 3 mmHg. FINDINGS  Left  Ventricle: Left ventricular ejection fraction, by estimation, is 60 to 65%. The left ventricle has normal function. The left ventricle has no regional wall motion abnormalities. The left ventricular internal cavity size was normal in size. There is  mild left ventricular hypertrophy. Left ventricular diastolic parameters are consistent with Grade I diastolic dysfunction (impaired relaxation). Right Ventricle: The right ventricular size is normal. No increase in right ventricular wall thickness. Right ventricular systolic function is normal. There is normal pulmonary artery systolic pressure. The tricuspid regurgitant velocity is 2.59 m/s, and  with an assumed right atrial pressure of 3 mmHg, the estimated right ventricular systolic pressure is 14.9 mmHg. Left Atrium: Left atrial size was normal in size. Right Atrium: Right atrial size was mildly dilated. Pericardium: There is no evidence of pericardial effusion. Mitral Valve: The mitral valve is normal in structure. No evidence of mitral valve regurgitation. No evidence of mitral valve stenosis. Tricuspid Valve: The tricuspid valve is normal in structure. Tricuspid valve regurgitation is trivial. Aortic Valve: The aortic valve is tricuspid. Aortic valve regurgitation is not visualized. No aortic stenosis is present. Pulmonic Valve: The pulmonic valve was normal in structure. Pulmonic valve  regurgitation is trivial. Aorta: The aortic root is normal in size and structure. Venous: The inferior vena cava is normal in size with greater than 50% respiratory variability, suggesting right atrial pressure of 3 mmHg. IAS/Shunts: No atrial level shunt detected by color flow Doppler.  LEFT VENTRICLE PLAX 2D LVIDd:         4.70 cm      Diastology LVIDs:         2.90 cm      LV e' lateral:   8.60 cm/s LV PW:         0.90 cm      LV E/e' lateral: 7.0 LV IVS:        0.90 cm      LV e' medial:    5.66 cm/s LVOT diam:     2.00 cm      LV E/e' medial:  10.7 LV SV:         74 LV SV Index:   37 LVOT Area:     3.14 cm  LV Volumes (MOD) LV vol d, MOD A2C: 91.0 ml LV vol d, MOD A4C: 122.0 ml LV vol s, MOD A2C: 32.8 ml LV vol s, MOD A4C: 39.6 ml LV SV MOD A2C:     58.2 ml LV SV MOD A4C:     122.0 ml LV SV MOD BP:      68.9 ml RIGHT VENTRICLE RV S prime:     18.40 cm/s TAPSE (M-mode): 2.6 cm LEFT ATRIUM             Index       RIGHT ATRIUM           Index LA diam:        3.60 cm 1.80 cm/m  RA Area:     20.90 cm LA Vol (A2C):   38.0 ml 19.03 ml/m RA Volume:   58.20 ml  29.14 ml/m LA Vol (A4C):   37.3 ml 18.67 ml/m LA Biplane Vol: 40.2 ml 20.13 ml/m  AORTIC VALVE LVOT Vmax:   113.00 cm/s LVOT Vmean:  62.300 cm/s LVOT VTI:    0.236 m  AORTA Ao Root diam: 3.30 cm MITRAL VALVE               TRICUSPID VALVE MV Area (PHT): 3.42 cm  TR Peak grad:   26.8 mmHg MV Decel Time: 222 msec    TR Vmax:        259.00 cm/s MV E velocity: 60.40 cm/s MV A velocity: 60.80 cm/s  SHUNTS MV E/A ratio:  0.99        Systemic VTI:  0.24 m                            Systemic Diam: 2.00 cm Loralie Champagne MD Electronically signed by Loralie Champagne MD Signature Date/Time: 01/27/2020/3:21:27 PM    Final    CT ADRENAL ABDOMEN WO CONTRAST  Result Date: 01/28/2020 CLINICAL DATA:  Adrenal nodule. EXAM: CT ABDOMEN WITHOUT CONTRAST TECHNIQUE: Multidetector CT imaging of the abdomen was performed following the standard protocol without IV contrast.  COMPARISON:  12/12/2019. FINDINGS: Lower chest: Mild volume are otherwise clear loss in the lower lobes. Lung bases are otherwise clear. Heart is at the upper limits of normal in size. No pericardial or pleural effusion. Moderate hiatal hernia. Hepatobiliary: Liver is unremarkable. Cholecystectomy. No biliary ductal dilatation. Pancreas: Negative. Spleen: Negative. Adrenals/Urinary Tract: Right adrenal gland is unremarkable. 1.6 cm left adrenal nodule measures 17-20 Hounsfield units. Excretion of residual IV contrast from study performed yesterday. Kidneys are otherwise unremarkable. Stomach/Bowel: Moderate hiatal hernia. Stomach and visualized portions of the small bowel and colon are otherwise unremarkable. Vascular/Lymphatic: Atherosclerotic calcification of the aorta without aneurysm. No pathologically enlarged lymph nodes. Other: Midline supraumbilical and umbilical hernias contain fat. No free fluid. Musculoskeletal: None. IMPRESSION: 1. 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 If further evaluation is desired, MR abdomen without and with contrast is recommended. 2. Moderate hiatal hernia. 3.  Aortic atherosclerosis (ICD10-I70.0). Electronically Signed   By: Lorin Picket M.D.   On: 01/28/2020 15:23   CT ANGIO HEAD CODE STROKE  Result Date: 01/27/2020 CLINICAL DATA:  Stroke follow-up. Slurred speech and facial numbness. EXAM: CT ANGIOGRAPHY HEAD AND NECK TECHNIQUE: Multidetector CT imaging of the head and neck was performed using the standard protocol during bolus administration of intravenous contrast. Multiplanar CT image reconstructions and MIPs were obtained to evaluate the vascular anatomy. Carotid stenosis measurements (when applicable) are obtained utilizing NASCET criteria, using the distal internal carotid diameter as the denominator. CONTRAST:  71mL OMNIPAQUE IOHEXOL 350 MG/ML SOLN COMPARISON:  MRI and CT from  01/27/2020 FINDINGS: CTA NECK FINDINGS Aortic arch: Imaged portion shows no evidence of aneurysm or dissection. No significant stenosis of the major arch vessel origins. Right carotid system: No evidence of dissection, stenosis (50% or greater) or occlusion. Left carotid system: No evidence of dissection, stenosis (50% or greater) or occlusion. Vertebral arteries: Codominant. No evidence of dissection, stenosis (50% or greater) or occlusion. Skeleton: Degenerative changes of the cervical spine without evidence of acute osseous abnormality. Other neck: There are multiple thyroid nodules, measuring up to 1.1 cm, which does not require further imaging follow-up per ACR guidelines. Upper chest: Clear. Review of the MIP images confirms the above findings CTA HEAD FINDINGS Similar size of the acute intraparenchymal hemorrhage centered in the left frontal operculum, further characterized on same day CT head and MRI. Anterior circulation: No significant stenosis, proximal occlusion, or aneurysm. No evidence of active extravasation in the region of the known left frontal operculum hemorrhage. No underlying vascular malformation identified; however, acute blood products limit evaluation. Posterior circulation: No significant stenosis, proximal occlusion, aneurysm, or vascular  malformation. Venous sinuses: As permitted by contrast timing, patent. Review of the MIP images confirms the above findings IMPRESSION: 1. No significant arterial stenosis or proximal occlusion. 2. Similar size of the acute intraparenchymal hemorrhage centered in the left frontal operculum, further characterized on same day CT head and MRI. No underlying vascular malformation identified; however, acute blood products limit evaluation. Electronically Signed   By: Margaretha Sheffield MD   On: 01/27/2020 15:51   CT ANGIO NECK CODE STROKE  Result Date: 01/27/2020 CLINICAL DATA:  Stroke follow-up. Slurred speech and facial numbness. EXAM: CT ANGIOGRAPHY  HEAD AND NECK TECHNIQUE: Multidetector CT imaging of the head and neck was performed using the standard protocol during bolus administration of intravenous contrast. Multiplanar CT image reconstructions and MIPs were obtained to evaluate the vascular anatomy. Carotid stenosis measurements (when applicable) are obtained utilizing NASCET criteria, using the distal internal carotid diameter as the denominator. CONTRAST:  52mL OMNIPAQUE IOHEXOL 350 MG/ML SOLN COMPARISON:  MRI and CT from 01/27/2020 FINDINGS: CTA NECK FINDINGS Aortic arch: Imaged portion shows no evidence of aneurysm or dissection. No significant stenosis of the major arch vessel origins. Right carotid system: No evidence of dissection, stenosis (50% or greater) or occlusion. Left carotid system: No evidence of dissection, stenosis (50% or greater) or occlusion. Vertebral arteries: Codominant. No evidence of dissection, stenosis (50% or greater) or occlusion. Skeleton: Degenerative changes of the cervical spine without evidence of acute osseous abnormality. Other neck: There are multiple thyroid nodules, measuring up to 1.1 cm, which does not require further imaging follow-up per ACR guidelines. Upper chest: Clear. Review of the MIP images confirms the above findings CTA HEAD FINDINGS Similar size of the acute intraparenchymal hemorrhage centered in the left frontal operculum, further characterized on same day CT head and MRI. Anterior circulation: No significant stenosis, proximal occlusion, or aneurysm. No evidence of active extravasation in the region of the known left frontal operculum hemorrhage. No underlying vascular malformation identified; however, acute blood products limit evaluation. Posterior circulation: No significant stenosis, proximal occlusion, aneurysm, or vascular malformation. Venous sinuses: As permitted by contrast timing, patent. Review of the MIP images confirms the above findings IMPRESSION: 1. No significant arterial stenosis  or proximal occlusion. 2. Similar size of the acute intraparenchymal hemorrhage centered in the left frontal operculum, further characterized on same day CT head and MRI. No underlying vascular malformation identified; however, acute blood products limit evaluation. Electronically Signed   By: Margaretha Sheffield MD   On: 01/27/2020 15:51      HISTORY OF PRESENT ILLNESS Emonie Payeton Germani is a 69 y.o. female with a history of hypertension who presents with slurred speech that started on awakening Sunday morning.  She states that she also had some headache that started this morning.  Due to this she sought care at her PCP who sent her for an MRI.  This revealed a hemorrhagic lesion and she was therefore referred to the emergency department. She denies extremity weakness or numbness, difficulty walking, double vision, or other symptoms.  She takes aspirin but no other blood thinners. She was LKW: 01/25/20 prior to bed. Modified Rankin Scale: 0. NIHSS: 4 (face, arm, leg, dysarthria). Admitted to the neuro ICU.      HOSPITAL COURSE Ms. Abrar Koone is a 69 y.o. female with history of hypertension, CKD and GERD who presents with slurred speech and headache. PCP ordered MRI -> hemorrhage.    Stroke:  Acute intraparenchymal hemorrhage in the left frontal operculum, etiology unclear. Rule ou  Resultant  Right hemiparesis  CT head - Acute intraparenchymal hemorrhage in the left frontal operculum measuring 2 cm with mild adjacent edema.  MRI head - 2.2 cm hemorrhagic lesion centered within the subcortical white matter of the left frontal operculum with surrounding small to moderate edema.  MRI w/ Contrast stable L frontal operculum hemorrhage. vascular enhancement which may be reactive. No mass demonstrated. 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 No source of embolus. 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 Moderate HH. Aortic atherosclerosis. Sars Corona Virus 2 - negative LDL  - 97 HgbA1c - 6.2 aspirin 81 mg daily prior to admission, now on No antithrombotic given hemorrhage  Therapy recommendations:  OP SLP. No OT, no PT Disposition:  return home   Hypertension Home BP meds: amlodipine - olmesartan ; metoprolol Current BP meds: amlodipine ; metoprolol ; irbesartan ; prn labetalol Stable SBP goal < 160 Long-term BP goal normotensive   Hyperlipidemia Home Lipid lowering medication: none  LDL 97 goal < 70 Current lipid lowering medication: none (statin contraindicated with ICH) Consider statin at follow up   Other Stroke Risk Factors Advanced age ETOH use, advised to drink no more than 1 alcoholic beverage per day. Obesity, Body mass index is 36.05 kg/m., recommend weight loss, diet and exercise as appropriate    Other Active Problems Anemia - Hgb - 10.5 CKD - stage  3a - creatinine 1.18  Mild bradycardia - 50's  DISCHARGE EXAM Blood pressure 137/87, pulse 63, temperature 98.1 F (36.7 C), temperature source Oral, resp. rate 20, height 5\' 4"  (1.626 m), weight 95.3 kg, SpO2 97 %. Pleasant middle-age lady not in distress. . Afebrile. Head is nontraumatic. Neck is supple without bruit.    Cardiac exam no murmur or gallop. Lungs are clear to auscultation. Distal pulses are well felt. Neurological Exam ;  Awake  Alert oriented x 3.  Mildly dysarthric speech but normal language.eye movements full without nystagmus.fundi were not visualized. Vision acuity and fields appear normal. Hearing is normal. Palatal movements are normal. Face asymmetric with mild right lower facial weakness.. Tongue midline. Mild right hemiparesis for/5 strength with weakness of right grip and intrinsic hand muscles and orbits left over right upper extremity.  Mild weakness of right hip flexors and ankle dorsiflexors only..  Normal sensation. Gait deferred.  Discharge Diet   Regular thin liquids  DISCHARGE PLAN Disposition:  Return home Outpatient SLP Due to hemorrhage and risk of bleeding, do not take aspirin, aspirin-containing medications, or ibuprofen products   Repeat MRI once hemorrhage resolves to look for underlying lesion Ongoing stroke risk factor control by Primary Care Physician at time of discharge Follow-up PCP Monico Blitz, MD in 2 weeks. Follow-up in [facility] in 4 weeks, office to schedule an appointment.   35 minutes were spent preparing discharge.  Burnetta Sabin, MSN, APRN, ANVP-BC, AGPCNP-BC Advanced Practice Stroke Nurse [place] for Schedule & Pager information 01/28/2020 3:32 PM   I have personally obtained history,examined this patient, reviewed notes, independently viewed imaging studies, participated in medical decision making and plan of care.ROS completed by me personally and pertinent positives fully documented  I have made any additions or clarifications directly to the above note. Agree with note above.    Antony Contras, MD Medical Director  [facility] Pager: [phone] 01/28/2020 4:12 PM

## 2020-02-12 ENCOUNTER — Other Ambulatory Visit: Payer: Self-pay | Admitting: Internal Medicine

## 2020-03-04 ENCOUNTER — Ambulatory Visit: Payer: Medicare Other | Admitting: Adult Health

## 2020-03-04 ENCOUNTER — Encounter: Payer: Self-pay | Admitting: Adult Health

## 2020-03-04 ENCOUNTER — Telehealth: Payer: Self-pay | Admitting: Adult Health

## 2020-03-04 VITALS — BP 144/82 | HR 61 | Ht 67.0 in | Wt 207.0 lb

## 2020-03-04 DIAGNOSIS — E785 Hyperlipidemia, unspecified: Secondary | ICD-10-CM

## 2020-03-04 DIAGNOSIS — I619 Nontraumatic intracerebral hemorrhage, unspecified: Secondary | ICD-10-CM | POA: Diagnosis not present

## 2020-03-04 DIAGNOSIS — I10 Essential (primary) hypertension: Secondary | ICD-10-CM

## 2020-03-04 MED ORDER — ATORVASTATIN CALCIUM 20 MG PO TABS: 20.0000 mg | ORAL_TABLET | Freq: Every day | ORAL | 3 refills | Status: DC

## 2020-03-04 NOTE — Telephone Encounter (Signed)
[employer] medicare order sent to GI. No auth they will reach out to the patient to schedule.  

## 2020-03-04 NOTE — Patient Instructions (Addendum)
Repeat MRI brain w/wo contrast to evaluate for underlying causes of your recent bleed  Start atorvastatin 20mg  daily for cholesterol management   Continue to follow up with PCP regarding cholesterol, blood pressure and diabetes management  Maintain strict control of hypertension with blood pressure goal below 130/90 and cholesterol with LDL cholesterol (bad cholesterol) goal below 70 mg/dL.     Followup in the future with me in 3 months or call earlier if needed       Thank you for coming to see [place] at [facility]. I hope we have been able to provide you high quality care today.  You may receive a patient satisfaction survey over the next few weeks. We would appreciate your feedback and comments so that we may continue to improve ourselves and the health of our patients.     Hemorrhagic Stroke  A hemorrhagic stroke is the sudden death of brain tissue that occurs when a blood vessel in the brain leaks or bursts (ruptures), causing bleeding in or around the brain(hemorrhage). When this happens, areas of the brain do not get enough oxygen, and blood builds up and presses on areas of the brain. Lack of oxygen and pressure from hemorrhaging can lead to brain damage and death. There are two major types of hemorrhagic stroke:  Intracerebral hemorrhage. This happens if bleeding occurs within the brain tissue.  Subarachnoid hemorrhage. This happens if bleeding occurs in the area between the brain and the membrane that covers the brain (subarachnoid space). Hemorrhagic stroke is a medical emergency. It can cause temporary or permanent brain damage and loss of brain function. What are the causes? This condition is caused by a blood vessel leaking or rupturing, which may be the result of:  Part of a weakened blood vessel wall bulging or ballooning out (cerebral aneurysm).  A hardened, thin blood vessel cracking open and allowing blood to leak out. Blood vessels may become  hardened and thin due to plaque buildup.  Tangled blood vessels in the brain (brain arteriovenous malformation).  Protein buildup in artery walls in the brain (amyloid angiopathy).  Inflamed blood vessels (vasculitis).  A tumor in the brain.  High blood pressure (hypertension). What increases the risk? The following factors may make you more likely to develop this condition:  Hypertension.  Having abnormal blood vessels present since birth (congenital abnormality).  Bleeding disorders, such as hemophilia, sickle cell disease, or liver disease.  The blood becoming too thin while taking blood thinners (anticoagulants).  Aging.  Moderate or heavy alcohol use.  Using drugs, such as cocaine or methamphetamines. What are the signs or symptoms? Symptoms of this condition usually appear suddenly, and may include:  Weakness or numbness of the face, arm, or leg, especially on one side of the body.  Confusion.  Difficulty speaking (aphasia) or understanding speech.  Difficulty seeing out of one or both eyes.  Difficulty walking or moving the arms or legs.  Dizziness.  Loss of balance or coordination.  Seizures.  A severe headache with no known cause. This headache may feel like the worst headache a person has ever experienced. How is this diagnosed? This condition may be diagnosed based on:  Your symptoms.  Your medical history.  A physical exam.  Tests, including: ? Blood tests. ? CT scan. ? MRI. ? CT angiography (CTA) or magnetic resonance angiography (MRA).  Catheter angiogram. In this procedure, dye is injected through a long, thin tube (catheter) into one of your arteries. Then, X-rays are taken.  The X-rays will show whether there is a blockage or a problem in a blood vessel. How is this treated? This condition is a medical emergency that must be treated in a hospital immediately. The goals of treatment are to stop bleeding, reduce pressure on the brain,  relieve symptoms, and prevent complications. Treatment for this condition may include:  Medicines that: ? Lower blood pressure (antihypertensives). ? Relieve pain (analgesics). ? Relieve nausea or vomiting. ? Stop or prevent seizures (anticonvulsants). ? Relieve fever. ? Prevent blood vessels in the brain from spasming in response to bleeding. ? Control bleeding in the brain.  Assisted breathing (ventilation). This involves using a machine to help you breathe (ventilator).  Receiving donated blood products through an IV (transfusion). You will receive cells that help your blood clot.  Placement of a tube (shunt) in the brain to relieve pressure.  Physical, speech, or occupational therapy.  Surgery to stop bleeding, remove a blood clot or tumor, or reduce pressure. Treatment depends on the cause, severity, and duration of symptoms. Medicines and changes to your diet may be used to help treat and manage risk factors for stroke, such as diabetes and high blood pressure. Recovery from hemorrhagic stroke varies widely. Talk with your health care provider about what to expect during your recovery. Follow these instructions at home: Activity  Use a walker or a cane as told by your health care provider.  Return to your normal activities as told by your health care provider. Ask your health care provider what activities are safe for you.  Rest. Rest helps the brain to heal. Make sure you: ? Get plenty of sleep. Avoid staying up late at night. ? Keep a consistent sleep schedule. Try to go to sleep and wake up at about the same time every day. ? Avoid activities that cause physical or mental stress. Lifestyle  Do not drink alcohol if: ? Your health care provider tells you not to drink. ? You are pregnant, may be pregnant, or are planning to become pregnant.  If you drink alcohol: ? Limit how much you use to:  0-1 drink a day for women.  0-2 drinks a day for men. ? Be aware of how  much alcohol is in your drink. In the [place] one drink equals one 12 oz bottle of beer (355 mL), one 5 oz glass of wine (148 mL), or one 1 oz glass of hard liquor (44 mL). General instructions  Do not drive or use heavy machinery until your health care provider approves.  Take over-the-counter and prescription medicines only as told by your health care provider.  Keep all follow-up visits as told by your health care provider, including visits with therapists. This is important. How is this prevented? Your risk of stroke can be decreased by working with your health care provider to treat:  High blood pressure.  High cholesterol.  Diabetes.  Heart disease.  Obesity. Your risk of stroke can also be decreased by quitting smoking, limiting alcohol, and staying physically active. If you take the blood thinner warfarin, have your bloodwork monitored frequently by your health care provider. Contact a health care provider if: You develop any of the following symptoms:  Headaches that keep coming back (chronic headaches).  Nausea.  Vision problems.  Increased sensitivity to noise or light.  Depression or mood swings.  Anxiety or irritability.  Memory problems.  Difficulty concentrating or paying attention.  Sleep problems.  Feeling tired all of the time. Get help right away  if you:  Have a partial or total loss of consciousness.  Are taking blood thinners and you fall or you experience minor injury to the head.  Have a bleeding disorder and you fall or you experience minor trauma to the head.  Have any symptoms of a stroke. "BE FAST" is an easy way to remember the main warning signs of a stroke: ? B - Balance. Signs are dizziness, sudden trouble walking, or loss of balance. ? E - Eyes. Signs are trouble seeing or a sudden change in vision. ? F - Face. Signs are sudden weakness or numbness of the face, or the face or eyelid drooping on one side. ? A - Arms. Signs are  weakness or numbness in an arm. This happens suddenly and usually on one side of the body. ? S - Speech. Signs are sudden trouble speaking, slurred speech, or trouble understanding what people say. ? T - Time. Time to call emergency services. Write down what time symptoms started.  Have other signs of a stroke, such as: ? A sudden, severe headache with no known cause. ? Nausea or vomiting. ? Seizure. These symptoms may represent a serious problem that is an emergency. Do not wait to see if the symptoms will go away. Get medical help right away. Call your local emergency services (911 in the [place] Do not drive yourself to the hospital. Summary  Hemorrhagic stroke is a medical emergency.  Hemorrhagic stroke is caused by bleeding in or around the brain.  Know the signs and symptoms of stroke.  Know your stroke risk factors. Work with your health care provider to decrease your risk of stroke. This information is not intended to replace advice given to you by your health care provider. Make sure you discuss any questions you have with your health care provider. Document Revised: 06/20/2018 Document Reviewed: 06/21/2018 Elsevier Patient Education  [place].

## 2020-03-04 NOTE — Progress Notes (Signed)
[facility] [address] [place]. [place] [address] [phone]       HOSPITAL FOLLOW UP NOTE  [name] Date of Birth:  1951-04-13 Medical Record Number:  [id number]   Reason for Referral:  hospital stroke follow up    SUBJECTIVE:   CHIEF COMPLAINT:  Chief Complaint  Patient presents with  . Hospitalization Follow-up    rm 9  . Cerebrovascular Accident    Pt said the sx she had was butter but she does get a headache every now and then    HPI:   [name] a 69 y.o.femalewith history of hypertension, CKD and GERDwho presented on 01/27/2020 with slurred speechand headache.  Personally reviewed hospitalization pertinent progress notes, lab work and imaging with summary provided.  Reported slurred speech since awakening on 8/29 and a headache that started the morning of admission.  She is scheduled appointment with PCP who sent her for MRI which showed hemorrhagic lesion and advised to proceed to ED.  Stroke work-up showed acute intraparenchymal hemorrhage in the left frontal operculum, etiology unclear.  MRI w/ contrast showed vascular enhancement which may be reactive without evidence of mass and development of venous anomaly adjacent L frontal lobe.  Previously on aspirin and advised to hold given hemorrhage.  HTN stable and advised to resume amlodipine and metoprolol and initiate irbesartan.  LDL 97 and recommended consideration of initiating statin at follow-up.  Prediabetes with A1c 6.2.  Stroke risk factors include advanced age, EtOH use and obesity.  Evaluated by therapy and recommended discharge home with outpatient SLP and no indication for OT or PT.  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  MRI head -2.2 cm hemorrhagic lesion  centered within the subcortical white matter of the left frontal operculum with surrounding small to moderate edema.   MRI w/ Contrast stable L frontal operculum hemorrhage. vascular enhancement which may be reactive. No mass demonstrated. 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 No source of embolus. 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 Moderate HH. Aortic atherosclerosis.  Sars Corona Virus 2- negative  LDL-97  HgbA1c- 6.2  aspirin 81 mg dailyprior to admission, now on No antithrombotic given hemorrhage   Therapy recommendations: OP SLP. No OT, no PT  Disposition: return home  Today, 03/04/2020, [name] is being seen for hospital follow-up unaccompanied.  Stable from stroke standpoint without residual deficits.  She initially was staying with her daughter at discharge but has since returned back to her home living with her son returning back to all prior activities.  Denies new or worsening stroke/TIA symptoms.  Has experienced occasional headache occurring every other day but not debilitating and overall improving.  Blood pressure today initially 155/97 and on recheck 144/82. She does not routinely monitor at home.  No further concerns at this time      ROS:   14 system review of systems performed and negative with exception of those listed in HPI  PMH:  Past Medical History:  Diagnosis Date  . GERD (gastroesophageal reflux disease)   . HTN (hypertension)   . Mixed hyperlipidemia 08/28/2018    PSH:  Past Surgical History:  Procedure  Laterality Date  . 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 APPENDECTOMY    . BIOPSY  08/13/2019   Procedure: BIOPSY;  Surgeon: Daneil Dolin, MD;  Location: [facility];  Service: Endoscopy;;  .  CHOLECYSTECTOMY    . COLONOSCOPY  12/2010   Dr. Sherrlyn Hock: Excellent..  Unable to navigate proximal to the hepatic flexure.  Otherwise visualized colon was normal.  Consider propofol, exam following up with barium enema.  . ESOPHAGOGASTRODUODENOSCOPY N/A 08/13/2019   Rourk: Esophagus normal, status post dilation for history of dysphagia.  Large hiatal hernia.  Gastric polyps removed, hyperplastic.  Marland Kitchen LEFT HEART CATH AND CORONARY ANGIOGRAPHY N/A 08/28/2018   Procedure: LEFT HEART CATH AND CORONARY ANGIOGRAPHY;  Surgeon: Adrian Prows, MD;  Location: [facility];  Service: Cardiovascular;  Laterality: N/A;  . Venia Minks DILATION N/A 08/13/2019   Procedure: Venia Minks DILATION;  Surgeon: Daneil Dolin, MD;  Location: [facility];  Service: Endoscopy;  Laterality: N/A;  . SHOULDER SURGERY Right   . TUBAL LIGATION      Social History:  Social History   Socioeconomic History  . Marital status: Widowed    Spouse name: Not on file  . Number of children: Not on file  . Years of education: Not on file  . Highest education level: Not on file  Occupational History  . Not on file  Tobacco Use  . Smoking status: Never Smoker  . Smokeless tobacco: Never Used  Vaping Use  . Vaping Use: Never used  Substance and Sexual Activity  . Alcohol use: Yes    Alcohol/week: 1.0 standard drink    Types: 1 Glasses of wine per week    Comment: socially  . Drug use: No  . Sexual activity: Not on file  Other Topics Concern  . Not on file  Social History Narrative  . Not on file   Social Determinants of Health   Financial Resource Strain:   . Difficulty of Paying Living Expenses: Not on file  Food Insecurity:   . Worried About Charity fundraiser in the Last Year: Not on file  . Ran Out of Food in the Last Year: Not on file  Transportation Needs:   . Lack of Transportation (Medical): Not on file  . Lack of Transportation (Non-Medical): Not on file  Physical Activity:   . Days of Exercise per  Week: Not on file  . Minutes of Exercise per Session: Not on file  Stress:   . Feeling of Stress : Not on file  Social Connections:   . Frequency of Communication with Friends and Family: Not on file  . Frequency of Social Gatherings with Friends and Family: Not on file  . Attends Religious Services: Not on file  . Active Member of Clubs or Organizations: Not on file  . Attends Archivist Meetings: Not on file  . Marital Status: Not on file  Intimate Partner Violence:   . Fear of Current or Ex-Partner: Not on file  . Emotionally Abused: Not on file  . Physically Abused: Not on file  . Sexually Abused: Not on file    Family History:  Family History  Problem Relation Age of Onset  . Diabetes Father   . Heart disease Father   . Hyperlipidemia Father   . Hypertension Father   . Varicose Veins Father   . Hypertension Brother     Medications:   Current Outpatient Medications on File  Prior to Visit  Medication Sig Dispense Refill  . amLODipine-olmesartan (AZOR) 10-40 MG per tablet Take 1 tablet by mouth every evening.     . Calcium Carbonate-Vitamin D (CALTRATE 600+D PO) Take 1 tablet by mouth every evening.     . Carboxymethylcellul-Glycerin (LUBRICATING EYE DROPS OP) Place 1 drop into both eyes daily as needed (dry eyes).    . Garlic 9811 MG CAPS Take 1,000 mg by mouth every other day.     . metoprolol tartrate (LOPRESSOR) 100 MG tablet Take 50 mg by mouth 2 (two) times daily.    . nitroGLYCERIN (NITROSTAT) 0.4 MG SL tablet Place 1 tablet (0.4 mg total) under the tongue every 5 (five) minutes x 3 doses as needed for chest pain. 25 tablet 1  . omeprazole (PRILOSEC) 40 MG capsule TAKE 1 CAPSULE BY MOUTH TWICE A DAY (Patient taking differently: Take 40 mg by mouth in the morning and at bedtime. ) 180 capsule 1   No current facility-administered medications on file prior to visit.    Allergies:  No Known Allergies    OBJECTIVE:  Physical Exam  Vitals:   03/04/20  1005 03/04/20 1028  BP: (!) 155/97 (!) 144/82  Pulse: 61   Weight: 207 lb (93.9 kg)   Height: 5\' 7"  (1.702 m)    Body mass index is 32.42 kg/m. No exam data present  No flowsheet data found.   General: well developed, well nourished,  pleasant middle-aged African-American female, seated, in no evident distress Head: head normocephalic and atraumatic.   Neck: supple with no carotid or supraclavicular bruits Cardiovascular: regular rate and rhythm, no murmurs Musculoskeletal: no deformity Skin:  no rash/petichiae Vascular:  Normal pulses all extremities   Neurologic Exam Mental Status: Awake and fully alert.  Fluent speech and language. Oriented to place and time. Recent and remote memory intact. Attention span, concentration and fund of knowledge appropriate. Mood and affect appropriate.  Cranial Nerves: Fundoscopic exam reveals sharp disc margins. Pupils equal, briskly reactive to light. Extraocular movements full without nystagmus. Visual fields full to confrontation. Hearing intact. Facial sensation intact.  Right lower facial weakness.  Tongue and palate moves normally and symmetrically.  Motor: Normal bulk and tone. Normal strength in all tested extremity muscles. Sensory.: intact to touch , pinprick , position and vibratory sensation.  Coordination: Rapid alternating movements normal in all extremities. Finger-to-nose and heel-to-shin performed accurately bilaterally. Gait and Station: Arises from chair without difficulty. Stance is normal. Gait demonstrates normal stride length and balance without use of assistive device Reflexes: 1+ and symmetric. Toes downgoing.     NIHSS  1 Modified Rankin  1      ASSESSMENT: Yesenia Bishop is a 69 y.o. year old female presented with slurred speech and headache on 01/27/2020 with stroke work-up revealing acute intraparenchymal hemorrhage in the left frontal operculum secondary to unknown source. Vascular risk factors include HTN, HLD,  advanced age, EtOH use and obesity.  No prior stroke history.     PLAN:  1. left frontal lobe IPH:  a. Unknown etiology -repeat MRI brain w/wo contrast to assess for underlying etiologies b. Residual deficit: Mild right lower facial weakness and mild headache.  Advised headache should continue to improve over time and no indication to initiate medication management as headache is not debilitating and gradually improving.  Advised use of Tylenol as needed and avoidance of aspirin or ibuprofen products c. Recommend initiating atorvastatin 20 mg daily for secondary stroke prevention.  No prior ischemic stroke  history or cardiac disease therefore no indication to restart aspirin 81 mg daily.   d. Discussed secondary stroke prevention measures and importance of close PCP follow up for aggressive stroke risk factor management  2. HTN: BP goal <130/90.  Stable.  On amlodipine-olmesartan and metoprolol per PCP 3. HLD: LDL goal <70. Recent LDL 97.  Initiate atorvastatin 20 g daily for secondary stroke prevention.      Follow up in 3 months or call earlier if needed   I spent 45 minutes of face-to-face and non-face-to-face time with patient.  This included previsit chart review, lab review, study review, order entry, electronic health record documentation, patient education regarding recent stroke, residual deficits, importance of managing stroke risk factors and answered all questions to patient satisfaction    Frann Rider, [facility]  [facility] [address] [place], [place] [address]  Phone [phone] Fax [phone] Note: This document was prepared with digital dictation and possible smart phrase technology. Any transcriptional errors that result from this process are unintentional.

## 2020-03-06 NOTE — Progress Notes (Signed)
I agree with the above plan 

## 2020-03-09 ENCOUNTER — Other Ambulatory Visit: Payer: Self-pay

## 2020-03-09 ENCOUNTER — Other Ambulatory Visit (HOSPITAL_COMMUNITY)
Admission: RE | Admit: 2020-03-09 | Discharge: 2020-03-09 | Disposition: A | Discharge status: Home / Self Care | Payer: Medicare Other | Source: Ambulatory Visit | Attending: Internal Medicine | Admitting: Internal Medicine

## 2020-03-09 DIAGNOSIS — Z01818 Encounter for other preprocedural examination: Secondary | ICD-10-CM | POA: Insufficient documentation

## 2020-03-09 DIAGNOSIS — Z20822 Contact with and (suspected) exposure to covid-19: Secondary | ICD-10-CM | POA: Diagnosis not present

## 2020-03-09 LAB — SARS CORONAVIRUS 2 (TAT 6-24 HRS): SARS Coronavirus 2: NEGATIVE

## 2020-03-10 ENCOUNTER — Encounter (HOSPITAL_COMMUNITY): Payer: Self-pay | Admitting: Anesthesiology

## 2020-03-10 ENCOUNTER — Other Ambulatory Visit (HOSPITAL_COMMUNITY): Payer: Medicare Other

## 2020-03-11 ENCOUNTER — Encounter (HOSPITAL_COMMUNITY)
Admission: RE | Disposition: A | Discharge status: Home / Self Care | Payer: Self-pay | Source: Home / Self Care | Attending: Internal Medicine

## 2020-03-11 ENCOUNTER — Other Ambulatory Visit: Payer: Self-pay

## 2020-03-11 ENCOUNTER — Encounter: Payer: Self-pay | Admitting: Internal Medicine

## 2020-03-11 ENCOUNTER — Encounter (HOSPITAL_COMMUNITY): Payer: Self-pay | Admitting: Internal Medicine

## 2020-03-11 ENCOUNTER — Ambulatory Visit (HOSPITAL_COMMUNITY)
Admission: RE | Admit: 2020-03-11 | Discharge: 2020-03-11 | Disposition: A | Discharge status: Home / Self Care | Payer: Medicare Other | Attending: Internal Medicine | Admitting: Internal Medicine

## 2020-03-11 DIAGNOSIS — R194 Change in bowel habit: Secondary | ICD-10-CM | POA: Insufficient documentation

## 2020-03-11 DIAGNOSIS — Z5309 Procedure and treatment not carried out because of other contraindication: Secondary | ICD-10-CM | POA: Diagnosis not present

## 2020-03-11 DIAGNOSIS — R109 Unspecified abdominal pain: Secondary | ICD-10-CM | POA: Diagnosis present

## 2020-03-11 SURGERY — COLONOSCOPY WITH PROPOFOL
Anesthesia: Monitor Anesthesia Care

## 2020-03-11 MED ORDER — CHLORHEXIDINE GLUCONATE CLOTH 2 % EX PADS: 6.0000 | MEDICATED_PAD | Freq: Once | CUTANEOUS | Status: DC

## 2020-03-11 MED ORDER — LACTATED RINGERS IV SOLN: Freq: Once | INTRAVENOUS | Status: AC

## 2020-03-11 NOTE — Anesthesia Preprocedure Evaluation (Deleted)
Anesthesia Evaluation  Patient identified by MRN, date of birth, ID band Patient awake    Reviewed: Allergy & Precautions, NPO status , Patient's Chart, lab work & pertinent test results, reviewed documented beta blocker date and time   History of Anesthesia Complications Negative for: history of anesthetic complications  Airway Mallampati: II  TM Distance: >3 FB Neck ROM: Full    Dental  (+) Dental Advisory Given, Missing   Pulmonary neg pulmonary ROS,    Pulmonary exam normal breath sounds clear to auscultation       Cardiovascular hypertension, Pt. on medications and Pt. on home beta blockers + Past MI   Rhythm:Regular Rate:Bradycardia  Coronary angiogram 08/28/2018: Generalized diffuse arteriomegaly with large vessels with minimal luminal irregularity.  Slow filling is evident in the coronary arteries which improved with nitroglycerin administration.  Marked ST-T wave changes were evident during contrast injection for the coronary arteries.  This suggests endothelial dysfunction. Normal LV systolic function, normal LVEDP.  Recommendation: Patient is already on a beta-blocker and calcium channel blocker, continue the same.  We will add statin to improve endothelial function.  Start with high-dose, can be adjusted once followed up with her PCP.  She will be discharged home today.  NTG prescription also given to the patient upon discharge.    Neuro/Psych CVA (left jaw wakness - 01/27/20), Residual Symptoms negative psych ROS   GI/Hepatic GERD  Medicated,  Endo/Other  negative endocrine ROS  Renal/GU Renal InsufficiencyRenal disease  negative genitourinary   Musculoskeletal negative musculoskeletal ROS (+)   Abdominal   Peds  Hematology negative hematology ROS (+)   Anesthesia Other Findings   Reproductive/Obstetrics negative OB ROS                            Anesthesia Physical Anesthesia  Plan  ASA:   Anesthesia Plan:    Post-op Pain Management:    Induction:   PONV Risk Score and Plan: TIVA  Airway Management Planned: Nasal Cannula, Natural Airway and Simple Face Mask  Additional Equipment:   Intra-op Plan:   Post-operative Plan:   Informed Consent:     Dental advisory given  Plan Discussed with: CRNA and Surgeon  Anesthesia Plan Comments: (Patient had hemorrhagic stroke less than 45 days ago, scheduled for MRI next week, procedure will be cancelled and rescheduled.)       Anesthesia Quick Evaluation

## 2020-03-11 NOTE — Discharge Instructions (Signed)
Colonoscopy canceled today due to a recent stroke.  Recommend office appointment with Neil Crouch December or January to reassess and reschedule as appropriate.

## 2020-03-11 NOTE — H&P (Signed)
  Patient with hemorrhagic stroke 6 weeks ago.  Presented here prepped for her colonoscopy.  She did not call the office.  We did not become aware of this until a.m. when she was reassessed by anesthesia and myself.  Neurology evaluation in progress.  Colonoscopy canceled by me.  I discussed with the patient at length this would be best practice.  We will plan to have her come back to the office in December or January to regroup.

## 2020-03-18 ENCOUNTER — Ambulatory Visit
Admission: RE | Admit: 2020-03-18 | Discharge: 2020-03-18 | Disposition: A | Discharge status: Home / Self Care | Payer: Medicare Other | Source: Ambulatory Visit | Attending: Adult Health | Admitting: Adult Health

## 2020-03-18 DIAGNOSIS — I619 Nontraumatic intracerebral hemorrhage, unspecified: Secondary | ICD-10-CM | POA: Diagnosis not present

## 2020-03-18 MED ORDER — GADOBENATE DIMEGLUMINE 529 MG/ML IV SOLN
20.0000 mL | Freq: Once | INTRAVENOUS | Status: AC | PRN
  Administered 2020-03-18: 20 mL via INTRAVENOUS

## 2020-04-01 ENCOUNTER — Telehealth: Payer: Self-pay

## 2020-04-01 NOTE — Telephone Encounter (Signed)
-----   Message from Yesenia Rider, NP sent at 04/01/2020  2:30 PM EDT ----- Please advise patient that MRI did show improvement of prior hemorrhage and did not show any underlying abnormalities that could have contributed to her bleed

## 2020-04-01 NOTE — Telephone Encounter (Signed)
Yesenia Bishop is a 69 y.o. female was contacted and notified of the message below. Pt has no additional questions or concerns.

## 2020-04-09 NOTE — Progress Notes (Signed)
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  It is unclear as to the hemorrhage was caused by a separate venous angioma which is now destroyed by the hemorrhage.  Cannot be sure

## 2020-04-28 ENCOUNTER — Ambulatory Visit: Payer: Medicare Other | Admitting: Gastroenterology

## 2020-04-28 ENCOUNTER — Encounter: Payer: Self-pay | Admitting: *Deleted

## 2020-04-28 ENCOUNTER — Encounter: Payer: Self-pay | Admitting: Gastroenterology

## 2020-04-28 ENCOUNTER — Other Ambulatory Visit: Payer: Self-pay

## 2020-04-28 VITALS — BP 133/87 | HR 74 | Temp 96.6°F | Ht 67.0 in | Wt 201.8 lb

## 2020-04-28 DIAGNOSIS — R198 Other specified symptoms and signs involving the digestive system and abdomen: Secondary | ICD-10-CM | POA: Diagnosis not present

## 2020-04-28 DIAGNOSIS — K5900 Constipation, unspecified: Secondary | ICD-10-CM | POA: Diagnosis not present

## 2020-04-28 DIAGNOSIS — R1012 Left upper quadrant pain: Secondary | ICD-10-CM

## 2020-04-28 MED ORDER — LUBIPROSTONE 24 MCG PO CAPS: 24.0000 ug | ORAL_CAPSULE | Freq: Two times a day (BID) | ORAL | 2 refills | Status: DC

## 2020-04-28 NOTE — Patient Instructions (Signed)
1. Start Amitiza 24 mcg 1-2 times daily with food for constipation.  You may hold for a day if you have diarrhea. 2. Colonoscopy as scheduled.  Please see separate instructions. 3. Consider MRI abdomen next June to follow-up on adrenal lesion.  We will send a reminder.

## 2020-04-28 NOTE — Progress Notes (Signed)
Primary Care Physician:  Monico Blitz, MD  Primary Gastroenterologist:  Garfield Cornea, MD   Chief Complaint  Patient presents with  . Abdominal Pain    mid abd to left side    HPI:  Yesenia Bishop is a 69 y.o. female here to reschedule colonoscopy.  She was last seen in June.  She has a history of constipation alternating with diarrhea, left upper quadrant abdominal pain.  Unrelated to meals.  Worse with laying on her side.  Attempted colonoscopy by Dr. Truddie Hidden in 2012, incomplete.  Scope could not be advanced all the way around to the sharp angle at the splenic flexure and redundant colon.  EGD March 2021 showed normal-appearing esophagus, empirically dilated for history of dysphagia. Large hiatal hernia.  Hyperplastic gastric polyp.  CT abdomen pelvis with contrast July 2021 to evaluate for abdominal pain showed moderate sized hiatal hernia, 3 small epigastric ventral hernias containing only fat, fatty liver.  1.7 cm indeterminate left adrenal lesion.  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  If further evaluation desired, MRI abdomen with and without contrast recommended.  Patient showed up for colonoscopy in October, prepped for exam, discovered to have had a hemorrhagic stroke 6 weeks prior, anesthesiology recommended canceling procedure.  Today she still complains of intermittent pain in the left abdomen. Sometimes can be bad. But can have days without pain. Worse with laying on that side at times. BM not that regular right now. Will use prune juice or senokot. Sometimes takes a laxative. No melena, brbpr. Heartburn intermittently but much better on omeprazole 40mg  bid. No N/V. No residual effects from CVA.     Current Outpatient Medications  Medication Sig Dispense Refill  . amLODipine-olmesartan (AZOR) 10-40 MG  per tablet Take 1 tablet by mouth every evening.     Marland Kitchen atorvastatin (LIPITOR) 20 MG tablet Take 1 tablet (20 mg total) by mouth daily. 90 tablet 3  . Calcium Carbonate-Vitamin D (CALTRATE 600+D PO) Take 1 tablet by mouth every evening.     . Carboxymethylcellul-Glycerin (LUBRICATING EYE DROPS OP) Place 1 drop into both eyes daily as needed (dry eyes).    . carvedilol (COREG) 6.25 MG tablet Take 6.25 mg by mouth at bedtime.    . Garlic 2979 MG CAPS Take 1,000 mg by mouth every other day.     . nitroGLYCERIN (NITROSTAT) 0.4 MG SL tablet Place 1 tablet (0.4 mg total) under the tongue every 5 (five) minutes x 3 doses as needed for chest pain. 25 tablet 1  . omeprazole (PRILOSEC) 40 MG capsule TAKE 1 CAPSULE BY MOUTH TWICE A DAY (Patient taking differently: Take 40 mg by mouth in the morning and at bedtime. ) 180 capsule 1   No current facility-administered medications for this visit.    Allergies as of 04/28/2020  . (No Known Allergies)    Past Medical History:  Diagnosis Date  . GERD (gastroesophageal reflux disease)   . HTN (hypertension)   . Mixed hyperlipidemia 08/28/2018    Past Surgical History:  Procedure Laterality Date  . 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 APPENDECTOMY    . BIOPSY  08/13/2019   Procedure: BIOPSY;  Surgeon: Daneil Dolin, MD;  [facility];  Service: Endoscopy;;  . CHOLECYSTECTOMY    . COLONOSCOPY  12/2010   Dr.  Sherrlyn Hock: Excellent..  Unable to navigate proximal to the hepatic flexure.  Otherwise visualized colon was normal.  Consider propofol, exam following up with barium enema.  . ESOPHAGOGASTRODUODENOSCOPY N/A 08/13/2019   Rourk: Esophagus normal, status post dilation for history of dysphagia.  Large hiatal hernia.  Gastric polyps removed, hyperplastic.  Marland Kitchen LEFT HEART CATH AND CORONARY ANGIOGRAPHY N/A 08/28/2018   Procedure: LEFT HEART CATH AND CORONARY ANGIOGRAPHY;  Surgeon: Adrian Prows, MD;  Location: [place]  [facility];  Service: Cardiovascular;  Laterality: N/A;  . Venia Minks DILATION N/A 08/13/2019   Procedure: Venia Minks DILATION;  Surgeon: Daneil Dolin, MD;  [facility];  Service: Endoscopy;  Laterality: N/A;  . SHOULDER SURGERY Right   . TUBAL LIGATION      Family History  Problem Relation Age of Onset  . Diabetes Father   . Heart disease Father   . Hyperlipidemia Father   . Hypertension Father   . Varicose Veins Father   . Hypertension Brother     Social History   Socioeconomic History  . Marital status: Widowed    Spouse name: Not on file  . Number of children: Not on file  . Years of education: Not on file  . Highest education level: Not on file  Occupational History  . Not on file  Tobacco Use  . Smoking status: Never Smoker  . Smokeless tobacco: Never Used  Vaping Use  . Vaping Use: Never used  Substance and Sexual Activity  . Alcohol use: Yes    Alcohol/week: 1.0 standard drink    Types: 1 Glasses of wine per week    Comment: socially  . Drug use: No  . Sexual activity: Not on file  Other Topics Concern  . Not on file  Social History Narrative  . Not on file   Social Determinants of Health   Financial Resource Strain:   . Difficulty of Paying Living Expenses: Not on file  Food Insecurity:   . Worried About Charity fundraiser in the Last Year: Not on file  . Ran Out of Food in the Last Year: Not on file  Transportation Needs:   . Lack of Transportation (Medical): Not on file  . Lack of Transportation (Non-Medical): Not on file  Physical Activity:   . Days of Exercise per Week: Not on file  . Minutes of Exercise per Session: Not on file  Stress:   . Feeling of Stress : Not on file  Social Connections:   . Frequency of Communication with Friends and Family: Not on file  . Frequency of Social Gatherings with Friends and Family: Not on file  . Attends Religious Services: Not on file  . Active Member of Clubs or Organizations: Not on file  .  Attends Archivist Meetings: Not on file  . Marital Status: Not on file  Intimate Partner Violence:   . Fear of Current or Ex-Partner: Not on file  . Emotionally Abused: Not on file  . Physically Abused: Not on file  . Sexually Abused: Not on file      ROS:  General: Negative for anorexia, weight loss, fever, chills, fatigue, weakness. Eyes: Negative for vision changes.  ENT: Negative for hoarseness, difficulty swallowing , nasal congestion. CV: Negative for chest pain, angina, palpitations, dyspnea on exertion, peripheral edema.  Respiratory: Negative for dyspnea at rest, dyspnea on exertion, cough, sputum, wheezing.  GI: See history of present illness. GU:  Negative for dysuria, hematuria, urinary incontinence, urinary  frequency, nocturnal urination.  MS: Negative for joint pain, low back pain.  Derm: Negative for rash or itching.  Neuro: Negative for weakness, abnormal sensation, seizure, frequent headaches, memory loss, confusion.  Psych: Negative for anxiety, depression, suicidal ideation, hallucinations.  Endo: Negative for unusual weight change.  Heme: Negative for bruising or bleeding. Allergy: Negative for rash or hives.    Physical Examination:  BP 133/87   Pulse 74   Temp (!) 96.6 F (35.9 C) (Temporal)   Ht 5\' 7"  (1.702 m)   Wt 201 lb 12.8 oz (91.5 kg)   BMI 31.61 kg/m    General: Well-nourished, well-developed in no acute distress.  Head: Normocephalic, atraumatic.   Eyes: Conjunctiva pink, no icterus. Mouth: masked Neck: Supple without thyromegaly, masses, or lymphadenopathy.  Lungs: Clear to auscultation bilaterally.  Heart: Regular rate and rhythm, no murmurs rubs or gallops.  Abdomen: Bowel sounds are normal, nontender, nondistended, no hepatosplenomegaly or masses, no abdominal bruits or    hernia , no rebound or guarding.   Rectal: not performed Extremities: No lower extremity edema. No clubbing or deformities.  Neuro: Alert and oriented x  4 , grossly normal neurologically.  Skin: Warm and dry, no rash or jaundice.   Psych: Alert and cooperative, normal mood and affect.  Labs: Lab Results  Component Value Date   CREATININE 1.18 (H) 01/27/2020   BUN 15 01/27/2020   NA 139 01/27/2020   K 3.7 01/27/2020   CL 103 01/27/2020   CO2 24 01/27/2020   Lab Results  Component Value Date   ALT 16 01/27/2020   AST 25 01/27/2020   ALKPHOS 76 01/27/2020   BILITOT <0.1 (L) 01/27/2020   Lab Results  Component Value Date   WBC 8.4 01/27/2020   HGB 10.5 (L) 01/27/2020   HCT 34.3 (L) 01/27/2020   MCV 80.3 01/27/2020   PLT 226 01/27/2020   Lab Results  Component Value Date   HGBA1C 6.2 (H) 01/27/2020   No results found for: IRON, TIBC, FERRITIN   Imaging Studies: No results found.   Impression/Plan:  69 y/o female with h/o GERD, large hiatal hernia, 1 year h/o intermittent LUQ, change in bowels.   GERD: reflux much better on omeprazole BID. EGD completed earlier this year. Reinforced antireflux measures.   Change in bowels with alternating constipation and diarrhea, predominance towards constipation now.  Has been self treating with prune juice or occasional laxative.  Prior colonoscopy attempt in 2012 incomplete due to sharp angle at the splenic flexure.  Recently colonoscopy canceled because of hemorrhagic stroke.  Recommend updating colonoscopy in January 2022 (greater than 3 months post stroke), deep sedation planned given history of incomplete colonoscopy due to angulation. ASA III.  I have discussed the risks, alternatives, benefits with regards to but not limited to the risk of reaction to medication, bleeding, infection, perforation and the patient is agreeable to proceed. Written consent to be obtained.  Chronic intermittent left upper quadrant pain for 1 year duration.  Evaluated via CT as outlined.  Cannot exclude musculoskeletal component.  Optimize bowel regimen to see if management of constipation improves  symptoms.  Start Amitiza 24 mcg 1-2 times daily with food.  Abnormal adrenal gland on CT.  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  Plan MRI abdomen with and without contrast in July 2022 for surveillance.

## 2020-04-29 ENCOUNTER — Encounter: Payer: Self-pay | Admitting: *Deleted

## 2020-05-03 ENCOUNTER — Telehealth: Payer: Self-pay | Admitting: Internal Medicine

## 2020-05-03 NOTE — Telephone Encounter (Signed)
[phone]  Please call patient, she has a medication question

## 2020-05-03 NOTE — Telephone Encounter (Signed)
Spoke with pt. Pt's Amitiza is $40 and she doesn't want to pay that cost. Medications $40 and under aren't eligible to de reduced per pts insurance company. Pt has taken Miralax that hasn't helped. Please advise of something else otc

## 2020-05-04 NOTE — Telephone Encounter (Signed)
Noted. There isn't another medication that will be less expensive on pts plan per pts insurance company. Spoke with pt. Pt was notified that she can take Miralax otc bid with water as directed.

## 2020-05-04 NOTE — Telephone Encounter (Signed)
Do we know if anything else is better covered ie Linzess or Trulance?  If not, then we may have to go back to miralax but use one capful twice daily.

## 2020-05-04 NOTE — Telephone Encounter (Signed)
Lmom, waiting on a return call.  

## 2020-06-28 ENCOUNTER — Telehealth: Payer: Self-pay | Admitting: Internal Medicine

## 2020-06-28 NOTE — Telephone Encounter (Signed)
Called pt. She wants to postpone procedure until April. I advised we do not that schedule and will call once we do. She voiced understanding

## 2020-06-28 NOTE — Telephone Encounter (Signed)
PATIENT WOULD LIKE TO POSTPONE HER PROCEDURE UNTIL April

## 2020-07-01 ENCOUNTER — Ambulatory Visit: Payer: Medicare Other | Admitting: Adult Health

## 2020-07-08 ENCOUNTER — Other Ambulatory Visit (HOSPITAL_COMMUNITY): Payer: Medicare Other

## 2020-07-12 ENCOUNTER — Ambulatory Visit (HOSPITAL_COMMUNITY): Admit: 2020-07-12 | Payer: Medicare Other | Admitting: Internal Medicine

## 2020-07-12 ENCOUNTER — Encounter (HOSPITAL_COMMUNITY): Payer: Self-pay

## 2020-07-12 SURGERY — COLONOSCOPY WITH PROPOFOL
Anesthesia: Monitor Anesthesia Care

## 2020-07-13 NOTE — Telephone Encounter (Signed)
LMOVM to call back to r/s 

## 2020-07-15 NOTE — Telephone Encounter (Signed)
Called pt, TCS w/Propofol w/Dr. Gala Romney ASA 3 rescheduled to 09/20/20--AM. Orders entered.

## 2020-07-16 NOTE — Telephone Encounter (Signed)
Pre-op/covid test 09/16/20 at 9:45am. Appt letter mailed with procedure instructions.

## 2020-07-28 ENCOUNTER — Ambulatory Visit: Payer: Medicare Other | Admitting: Adult Health

## 2020-07-28 ENCOUNTER — Other Ambulatory Visit: Payer: Self-pay

## 2020-07-28 ENCOUNTER — Encounter: Payer: Self-pay | Admitting: Adult Health

## 2020-07-28 VITALS — BP 152/97 | HR 66 | Ht 67.0 in | Wt 202.0 lb

## 2020-07-28 DIAGNOSIS — I619 Nontraumatic intracerebral hemorrhage, unspecified: Secondary | ICD-10-CM | POA: Diagnosis not present

## 2020-07-28 NOTE — Progress Notes (Signed)
[facility] [address] [place]. [place] [address] [phone]       STROKE FOLLOW UP NOTE  Yesenia Bishop Date of Birth:  1950-10-04 Medical Record Number:  [id number]   Reason for visit: stroke follow up    SUBJECTIVE:   CHIEF COMPLAINT:  Chief Complaint  Patient presents with  . Follow-up    RM 14 alone PT is well, no complaints     HPI:   Today, 07/28/2020, Yesenia Bishop returns for stroke follow-up unaccompanied.  Residual occasional slurred speech and intermittent/rare headache but overall greatly improving She has been stable from stroke standpoint without new or recurring stroke/TIA symptoms  Reports compliance on atorvastatin 20 mg daily -denies side effects Blood pressure today 152/97 -occasionally monitors at home and typically 130-140/90s  No concerns at this time     History provided for reference purposes only Initial visit 03/04/2020 JM: Yesenia Bishop is being seen for hospital follow-up unaccompanied.  Stable from stroke standpoint without residual deficits.  She initially was staying with her daughter at discharge but has since returned back to her home living with her son returning back to all prior activities.  Denies new or worsening stroke/TIA symptoms.  Has experienced occasional headache occurring every other day but not debilitating and overall improving.  Blood pressure today initially 155/97 and on recheck 144/82. She does not routinely monitor at home.  No further concerns at this time  Stroke admission 01/27/2020 YeseniaYesenia Bishop [facility] a 70 y.o.femalewith history of hypertension, CKD and GERDwho presented on 01/27/2020 with slurred speechand headache.  Personally reviewed hospitalization pertinent progress notes, lab work and imaging with summary provided.  Reported slurred speech since awakening on 8/29 and a headache that started the morning of admission.  She is scheduled appointment with PCP who sent her for MRI which  showed hemorrhagic lesion and advised to proceed to ED.  Stroke work-up showed acute intraparenchymal hemorrhage in the left frontal operculum, etiology unclear.  MRI w/ contrast showed vascular enhancement which may be reactive without evidence of mass and development of venous anomaly adjacent L frontal lobe.  Previously on aspirin and advised to hold given hemorrhage.  HTN stable and advised to resume amlodipine and metoprolol and initiate irbesartan.  LDL 97 and recommended consideration of initiating statin at follow-up.  Prediabetes with A1c 6.2.  Stroke risk factors include advanced age, EtOH use and obesity.  Evaluated by therapy and recommended discharge home with outpatient SLP and no indication for OT or PT.  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  MRI head -2.2 cm hemorrhagic lesion centered within the subcortical white matter of the left frontal operculum with surrounding small to moderate edema.   MRI w/ Contrast stable L frontal operculum hemorrhage. vascular enhancement which may be reactive. No mass demonstrated. 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 No source of embolus. 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 Moderate HH. Aortic atherosclerosis.  Sars Corona Virus 2- negative  LDL-97  HgbA1c- 6.2  aspirin 81 mg dailyprior to admission, now on No antithrombotic given hemorrhage   Therapy recommendations: OP SLP. No OT, no PT  Disposition: return home  ROS:   14 system review of systems performed and negative with exception of those listed in HPI  PMH:   Past Medical History:  Diagnosis Date  . GERD (gastroesophageal reflux disease)   . HTN (hypertension)   . Mixed hyperlipidemia 08/28/2018    PSH:  Past Surgical History:  Procedure Laterality Date  . 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 APPENDECTOMY    . BIOPSY  08/13/2019   Procedure: BIOPSY;  Surgeon: Daneil Dolin, MD;  [facility];  Service: Endoscopy;;  . CHOLECYSTECTOMY    . COLONOSCOPY  12/2010   Dr. Sherrlyn Hock: Excellent..  Unable to navigate proximal to the hepatic flexure.  Otherwise visualized colon was normal.  Consider propofol, exam following up with barium enema.  . ESOPHAGOGASTRODUODENOSCOPY N/A 08/13/2019   Rourk: Esophagus normal, status post dilation for history of dysphagia.  Large hiatal hernia.  Gastric polyps removed, hyperplastic.  Marland Kitchen LEFT HEART CATH AND CORONARY ANGIOGRAPHY N/A 08/28/2018   Procedure: LEFT HEART CATH AND CORONARY ANGIOGRAPHY;  Surgeon: Adrian Prows, MD;  Location: [place] [facility];  Service: Cardiovascular;  Laterality: N/A;  . Venia Minks DILATION N/A 08/13/2019   Procedure: Venia Minks DILATION;  Surgeon: Daneil Dolin, MD;  [facility];  Service: Endoscopy;  Laterality: N/A;  . SHOULDER SURGERY Right   . TUBAL LIGATION      Social History:  Social History   Socioeconomic History  . Marital status: Widowed    Spouse name: Not on file  . Number of children: Not on file  . Years of education: Not on file  . Highest education level: Not on file  Occupational History  . Not on file  Tobacco Use  . Smoking status: Never Smoker  . Smokeless tobacco: Never Used  Vaping Use  . Vaping Use: Never used  Substance and Sexual Activity  . Alcohol use: Yes    Alcohol/week: 1.0 standard drink    Types: 1 Glasses of wine per week    Comment: socially  . Drug use: No  . Sexual activity: Not on file  Other Topics Concern  . Not on file  Social History Narrative  . Not on file   Social  Determinants of Health   Financial Resource Strain: Not on file  Food Insecurity: Not on file  Transportation Needs: Not on file  Physical Activity: Not on file  Stress: Not on file  Social Connections: Not on file  Intimate Partner Violence: Not on file    Family History:  Family History  Problem Relation Age of Onset  . Diabetes Father   . Heart disease Father   . Hyperlipidemia Father   . Hypertension Father   . Varicose Veins Father   . Hypertension Brother     Medications:   Current Outpatient Medications on File Prior to Visit  Medication Sig Dispense Refill  . amLODipine-olmesartan (AZOR) 10-40 MG tablet Take 1 tablet by mouth every evening.     Marland Kitchen atorvastatin (LIPITOR) 20 MG tablet Take 1 tablet (20 mg total) by mouth daily. 90 tablet 3  . Calcium Carbonate-Vitamin D (CALTRATE 600+D PO) Take 1 tablet by mouth every evening.     . Carboxymethylcellul-Glycerin (LUBRICATING EYE DROPS OP) Place 1 drop into both eyes daily as needed (dry eyes).    . carvedilol (COREG) 6.25 MG tablet Take 6.25 mg by mouth at bedtime.    . Garlic 4967 MG CAPS Take 1,000 mg by mouth  every other day.     Marland Kitchen omeprazole (PRILOSEC) 40 MG capsule TAKE 1 CAPSULE BY MOUTH TWICE A DAY (Patient taking differently: Take 40 mg by mouth in the morning and at bedtime.) 180 capsule 1   No current facility-administered medications on file prior to visit.    Allergies:  No Known Allergies    OBJECTIVE:  Physical Exam  Vitals:   07/28/20 1257  BP: (!) 152/97  Pulse: 66  Weight: 202 lb (91.6 kg)  Height: 5\' 7"  (1.702 m)   Body mass index is 31.64 kg/m. No exam data present  General: well developed, well nourished,  pleasant middle-aged African-American female, seated, in no evident distress Head: head normocephalic and atraumatic.   Neck: supple with no carotid or supraclavicular bruits Cardiovascular: regular rate and rhythm, no murmurs Musculoskeletal: no deformity Skin:  no  rash/petichiae Vascular:  Normal pulses all extremities   Neurologic Exam Mental Status: Awake and fully alert.  Fluent speech and language. Oriented to place and time. Recent and remote memory intact. Attention span, concentration and fund of knowledge appropriate. Mood and affect appropriate.  Cranial Nerves: Pupils equal, briskly reactive to light. Extraocular movements full without nystagmus. Visual fields full to confrontation. Hearing intact. Facial sensation intact.  Mild right lower facial weakness when smiling.  Tongue and palate moves normally and symmetrically.  Motor: Normal bulk and tone. Normal strength in all tested extremity muscles. Sensory.: intact to touch , pinprick , position and vibratory sensation.  Coordination: Rapid alternating movements normal in all extremities. Finger-to-nose and heel-to-shin performed accurately bilaterally. Gait and Station: Arises from chair without difficulty. Stance is normal. Gait demonstrates normal stride length and balance without use of assistive device Reflexes: 1+ and symmetric. Toes downgoing.          ASSESSMENT: Shaquinta Peruski is a 70 y.o. year old female presented with slurred speech and headache on 01/27/2020 with stroke work-up revealing acute intraparenchymal hemorrhage in the left frontal operculum secondary to unknown source. Vascular risk factors include HTN, HLD, advanced age, EtOH use and obesity.  No prior stroke history.     PLAN:  1. left frontal lobe IPH:  a. MR brain w/wo 03/18/2020: Expected evolutionary changes in the subacute left frontal subcortical hematoma with resolution of previously seen edema and mass-effect.  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 b. Residual deficit: Slight right lower facial weakness when smiling and subjective occasional slurred speech - overall improvement since prior visit c. Continue atorvastatin 20 mg daily for secondary stroke prevention.  No prior ischemic stroke  history or cardiac disease therefore no indication to restart aspirin 81 mg daily at this present time.   d. Discussed secondary stroke prevention measures and importance of close PCP follow up for aggressive stroke risk factor management  2. HTN: BP goal <130/90.  Stable.  On amlodipine-olmesartan and metoprolol per PCP 3. HLD: LDL goal <70.  Prior LDL 97.  Initiate atorvastatin 20 g daily for secondary stroke prevention.      Follow up in 6 months or call earlier if needed   CC:  [facility] provider: Dr. Nolon Rod, Weldon Picking, MD    I spent 30 minutes of face-to-face and non-face-to-face time with patient.  This included previsit chart review, lab review, study review, order entry, electronic health record documentation, patient education regarding prior stroke including repeat MRI and possible underlying etiologies, residual deficits, importance of managing stroke risk factors and answered all other questions to patient satisfaction  Frann Rider, AGNP-BC  [facility] [address] [place], [place] [address]  Phone [phone] Fax [phone] Note: This document was prepared with digital dictation and possible smart phrase technology. Any transcriptional errors that result from this process are unintentional.

## 2020-07-28 NOTE — Progress Notes (Signed)
I agree with the above plan 

## 2020-07-28 NOTE — Patient Instructions (Signed)
Continue atorvastatin 20 mg daily for secondary stroke prevention  Continue to follow up with PCP regarding cholesterol and blood pressure management  Maintain strict control of hypertension with blood pressure goal below 130/90 and cholesterol with LDL cholesterol (bad cholesterol) goal below 70 mg/dL.       Followup in the future with me in 6 months or call earlier if needed       Thank you for coming to see [place] at [facility]. I hope we have been able to provide you high quality care today.  You may receive a patient satisfaction survey over the next few weeks. We would appreciate your feedback and comments so that we may continue to improve ourselves and the health of our patients.

## 2020-09-13 NOTE — Patient Instructions (Signed)
Yesenia Bishop  09/13/2020     @PREFPERIOPPHARMACY @   Your procedure is scheduled on  09/20/2020.   Report to [facility] at  [phone]  A.M.   Call this number if you have problems the morning of surgery:  [phone]   Remember:  Follow the diet and pre instructions given to you by the office.                      Take these medicines the morning of surgery with A SIP OF WATER  Amlodipine, prilosec.     Please brush your teeth.  Do not wear jewelry, make-up or nail polish.  Do not wear lotions, powders, or perfumes, or deodorant.  Do not shave 48 hours prior to surgery.  Men may shave face and neck.  Do not bring valuables to the hospital.  [facility] is not responsible for any belongings or valuables.   Contacts, dentures or bridgework may not be worn into surgery.  Leave your suitcase in the car.  After surgery it may be brought to your room.  For patients admitted to the hospital, discharge time will be determined by your treatment team.  Patients discharged the day of surgery will not be allowed to drive home and must have someone with them for 24 hours.    Special instructions:  DO NOT smoke tobacco or vape for 24 hours before your procedure.   Please read over the following fact sheets that you were given. Anesthesia Post-op Instructions and Care and Recovery After Surgery       Colonoscopy, Adult, Care After This sheet gives you information about how to care for yourself after your procedure. Your health care provider may also give you more specific instructions. If you have problems or questions, contact your health care provider. What can I expect after the procedure? After the procedure, it is common to have:  A small amount of blood in your stool for 24 hours after the procedure.  Some gas.  Mild cramping or bloating of your abdomen. Follow these instructions at home: Eating and drinking  Drink enough fluid to keep your urine pale  yellow.  Follow instructions from your health care provider about eating or drinking restrictions.  Resume your normal diet as instructed by your health care provider. Avoid heavy or fried foods that are hard to digest.   Activity  Rest as told by your health care provider.  Avoid sitting for a long time without moving. Get up to take short walks every 1-2 hours. This is important to improve blood flow and breathing. Ask for help if you feel weak or unsteady.  Return to your normal activities as told by your health care provider. Ask your health care provider what activities are safe for you. Managing cramping and bloating  Try walking around when you have cramps or feel bloated.  Apply heat to your abdomen as told by your health care provider. Use the heat source that your health care provider recommends, such as a moist heat pack or a heating pad. ? Place a towel between your skin and the heat source. ? Leave the heat on for 20-30 minutes. ? Remove the heat if your skin turns bright red. This is especially important if you are unable to feel pain, heat, or cold. You may have a greater risk of getting burned.   General instructions  If you were given a sedative during the procedure,  it can affect you for several hours. Do not drive or operate machinery until your health care provider says that it is safe.  For the first 24 hours after the procedure: ? Do not sign important documents. ? Do not drink alcohol. ? Do your regular daily activities at a slower pace than normal. ? Eat soft foods that are easy to digest.  Take over-the-counter and prescription medicines only as told by your health care provider.  Keep all follow-up visits as told by your health care provider. This is important. Contact a health care provider if:  You have blood in your stool 2-3 days after the procedure. Get help right away if you have:  More than a small spotting of blood in your stool.  Large blood  clots in your stool.  Swelling of your abdomen.  Nausea or vomiting.  A fever.  Increasing pain in your abdomen that is not relieved with medicine. Summary  After the procedure, it is common to have a small amount of blood in your stool. You may also have mild cramping and bloating of your abdomen.  If you were given a sedative during the procedure, it can affect you for several hours. Do not drive or operate machinery until your health care provider says that it is safe.  Get help right away if you have a lot of blood in your stool, nausea or vomiting, a fever, or increased pain in your abdomen. This information is not intended to replace advice given to you by your health care provider. Make sure you discuss any questions you have with your health care provider. Document Revised: 05/09/2019 Document Reviewed: 12/09/2018 Elsevier Patient Education  2021 [place] After This sheet gives you information about how to care for yourself after your procedure. Your health care provider may also give you more specific instructions. If you have problems or questions, contact your health care provider. What can I expect after the procedure? After the procedure, it is common to have:  Tiredness.  Forgetfulness about what happened after the procedure.  Impaired judgment for important decisions.  Nausea or vomiting.  Some difficulty with balance. Follow these instructions at home: For the time period you were told by your health care provider:  Rest as needed.  Do not participate in activities where you could fall or become injured.  Do not drive or use machinery.  Do not drink alcohol.  Do not take sleeping pills or medicines that cause drowsiness.  Do not make important decisions or sign legal documents.  Do not take care of children on your own.      Eating and drinking  Follow the diet that is recommended by your health care  provider.  Drink enough fluid to keep your urine pale yellow.  If you vomit: ? Drink water, juice, or soup when you can drink without vomiting. ? Make sure you have little or no nausea before eating solid foods. General instructions  Have a responsible adult stay with you for the time you are told. It is important to have someone help care for you until you are awake and alert.  Take over-the-counter and prescription medicines only as told by your health care provider.  If you have sleep apnea, surgery and certain medicines can increase your risk for breathing problems. Follow instructions from your health care provider about wearing your sleep device: ? Anytime you are sleeping, including during daytime naps. ? While taking prescription pain medicines, sleeping  medicines, or medicines that make you drowsy.  Avoid smoking.  Keep all follow-up visits as told by your health care provider. This is important. Contact a health care provider if:  You keep feeling nauseous or you keep vomiting.  You feel light-headed.  You are still sleepy or having trouble with balance after 24 hours.  You develop a rash.  You have a fever.  You have redness or swelling around the IV site. Get help right away if:  You have trouble breathing.  You have new-onset confusion at home. Summary  For several hours after your procedure, you may feel tired. You may also be forgetful and have poor judgment.  Have a responsible adult stay with you for the time you are told. It is important to have someone help care for you until you are awake and alert.  Rest as told. Do not drive or operate machinery. Do not drink alcohol or take sleeping pills.  Get help right away if you have trouble breathing, or if you suddenly become confused. This information is not intended to replace advice given to you by your health care provider. Make sure you discuss any questions you have with your health care  provider. Document Revised: 01/29/2020 Document Reviewed: 04/17/2019 Elsevier Patient Education  2021 [employer].

## 2020-09-16 ENCOUNTER — Encounter (HOSPITAL_COMMUNITY): Payer: Self-pay

## 2020-09-16 ENCOUNTER — Other Ambulatory Visit (HOSPITAL_COMMUNITY)
Admission: RE | Admit: 2020-09-16 | Discharge: 2020-09-16 | Disposition: A | Discharge status: Home / Self Care | Payer: Medicare Other | Source: Ambulatory Visit | Attending: Internal Medicine | Admitting: Internal Medicine

## 2020-09-16 ENCOUNTER — Encounter (HOSPITAL_COMMUNITY)
Admission: RE | Admit: 2020-09-16 | Discharge: 2020-09-16 | Disposition: A | Discharge status: Home / Self Care | Payer: Medicare Other | Source: Ambulatory Visit | Attending: Internal Medicine | Admitting: Internal Medicine

## 2020-09-16 ENCOUNTER — Other Ambulatory Visit: Payer: Self-pay

## 2020-09-16 DIAGNOSIS — Z20822 Contact with and (suspected) exposure to covid-19: Secondary | ICD-10-CM | POA: Diagnosis not present

## 2020-09-16 DIAGNOSIS — Z01812 Encounter for preprocedural laboratory examination: Secondary | ICD-10-CM | POA: Insufficient documentation

## 2020-09-16 LAB — BASIC METABOLIC PANEL
Anion gap: 9 (ref 5–15)
BUN: 22 mg/dL (ref 8–23)
CO2: 27 mmol/L (ref 22–32)
Calcium: 9.8 mg/dL (ref 8.9–10.3)
Chloride: 102 mmol/L (ref 98–111)
Creatinine, Ser: 0.9 mg/dL (ref 0.44–1.00)
GFR, Estimated: >60 mL/min (ref >60)
Glucose, Bld: 94 mg/dL (ref 70–99)
Potassium: 3.8 mmol/L (ref 3.5–5.1)
Sodium: 138 mmol/L (ref 135–145)

## 2020-09-16 LAB — SARS CORONAVIRUS 2 (TAT 6-24 HRS): SARS Coronavirus 2: NEGATIVE

## 2020-09-20 ENCOUNTER — Ambulatory Visit (HOSPITAL_COMMUNITY): Payer: Medicare Other | Admitting: Anesthesiology

## 2020-09-20 ENCOUNTER — Encounter (HOSPITAL_COMMUNITY): Payer: Self-pay | Admitting: Internal Medicine

## 2020-09-20 ENCOUNTER — Ambulatory Visit (HOSPITAL_COMMUNITY)
Admission: RE | Admit: 2020-09-20 | Discharge: 2020-09-20 | Disposition: A | Discharge status: Home / Self Care | Payer: Medicare Other | Attending: Internal Medicine | Admitting: Internal Medicine

## 2020-09-20 ENCOUNTER — Encounter (HOSPITAL_COMMUNITY)
Admission: RE | Disposition: A | Discharge status: Home / Self Care | Payer: Self-pay | Source: Home / Self Care | Attending: Internal Medicine

## 2020-09-20 DIAGNOSIS — Q438 Other specified congenital malformations of intestine: Secondary | ICD-10-CM | POA: Diagnosis not present

## 2020-09-20 DIAGNOSIS — R194 Change in bowel habit: Secondary | ICD-10-CM | POA: Diagnosis present

## 2020-09-20 DIAGNOSIS — Z79899 Other long term (current) drug therapy: Secondary | ICD-10-CM | POA: Insufficient documentation

## 2020-09-20 DIAGNOSIS — D122 Benign neoplasm of ascending colon: Secondary | ICD-10-CM | POA: Insufficient documentation

## 2020-09-20 DIAGNOSIS — K5900 Constipation, unspecified: Secondary | ICD-10-CM | POA: Insufficient documentation

## 2020-09-20 HISTORY — PX: COLONOSCOPY WITH PROPOFOL: SHX5780

## 2020-09-20 HISTORY — PX: POLYPECTOMY: SHX5525

## 2020-09-20 SURGERY — COLONOSCOPY WITH PROPOFOL
Anesthesia: General

## 2020-09-20 MED ORDER — EPHEDRINE 5 MG/ML INJ
INTRAVENOUS | Status: AC
  Filled 2020-09-20: qty 10

## 2020-09-20 MED ORDER — EPHEDRINE SULFATE-NACL 50-0.9 MG/10ML-% IV SOSY
PREFILLED_SYRINGE | INTRAVENOUS | Status: DC | PRN
  Administered 2020-09-20 (×2): 5 mg via INTRAVENOUS

## 2020-09-20 MED ORDER — PROPOFOL 500 MG/50ML IV EMUL
INTRAVENOUS | Status: DC | PRN
  Administered 2020-09-20: 150 ug/kg/min via INTRAVENOUS

## 2020-09-20 MED ORDER — PHENYLEPHRINE 40 MCG/ML (10ML) SYRINGE FOR IV PUSH (FOR BLOOD PRESSURE SUPPORT)
PREFILLED_SYRINGE | INTRAVENOUS | Status: DC | PRN
  Administered 2020-09-20 (×2): 120 ug via INTRAVENOUS

## 2020-09-20 MED ORDER — PHENYLEPHRINE 40 MCG/ML (10ML) SYRINGE FOR IV PUSH (FOR BLOOD PRESSURE SUPPORT)
PREFILLED_SYRINGE | INTRAVENOUS | Status: AC
  Filled 2020-09-20: qty 10

## 2020-09-20 MED ORDER — LACTATED RINGERS IV SOLN
INTRAVENOUS | Status: DC
  Administered 2020-09-20: 1000 mL via INTRAVENOUS

## 2020-09-20 MED ORDER — PROPOFOL 10 MG/ML IV BOLUS
INTRAVENOUS | Status: DC | PRN
  Administered 2020-09-20: 50 mg via INTRAVENOUS
  Administered 2020-09-20: 100 mg via INTRAVENOUS

## 2020-09-20 MED ORDER — LIDOCAINE HCL (CARDIAC) PF 100 MG/5ML IV SOSY
PREFILLED_SYRINGE | INTRAVENOUS | Status: DC | PRN
  Administered 2020-09-20: 50 mg via INTRAVENOUS

## 2020-09-20 NOTE — H&P (Signed)
@LOGO @   Primary Care Physician:  Monico Blitz, MD Primary Gastroenterologist:  Dr. Gala Romney  Pre-Procedure History & Physical: HPI:  Yesenia Bishop is a 70 y.o. female here for colonoscopy.  Change in bowel habits-constipation.  Last colonoscopy elsewhere many years ago incomplete because of technical difficulties. Previously on Amitiza; now on MiraLAX.  Past Medical History:  Diagnosis Date  . GERD (gastroesophageal reflux disease)   . HTN (hypertension)   . Mixed hyperlipidemia 08/28/2018  . Myocardial infarction ([place]) 2020  . Stroke [facility]) 12/2019   no deficits from the stroke    Past Surgical History:  Procedure Laterality Date  . 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 APPENDECTOMY    . BIOPSY  08/13/2019   Procedure: BIOPSY;  Surgeon: Daneil Dolin, MD;  Location[facility];  Service: Endoscopy;;  . CHOLECYSTECTOMY    . COLONOSCOPY  12/2010   Dr. Sherrlyn Hock: Excellent..  Unable to navigate proximal to the hepatic flexure.  Otherwise visualized colon was normal.  Consider propofol, exam following up with barium enema.  . ESOPHAGOGASTRODUODENOSCOPY N/A 08/13/2019   Aneliese Beaudry: Esophagus normal, status post dilation for history of dysphagia.  Large hiatal hernia.  Gastric polyps removed, hyperplastic.  Marland Kitchen LEFT HEART CATH AND CORONARY ANGIOGRAPHY N/A 08/28/2018   Procedure: LEFT HEART CATH AND CORONARY ANGIOGRAPHY;  Surgeon: Adrian Prows, MD;  Location: [place] CV LAB;  Service: Cardiovascular;  Laterality: N/A;  . Venia Minks DILATION N/A 08/13/2019   Procedure: Venia Minks DILATION;  Surgeon: Daneil Dolin, MD;  Location: [facility];  Service: Endoscopy;  Laterality: N/A;  . SHOULDER SURGERY Right   . TUBAL LIGATION      Prior to Admission medications   Medication Sig Start Date End Date Taking? Authorizing Provider  amLODipine-olmesartan (AZOR) 10-40 MG tablet Take 1 tablet by mouth every evening.    Yes [provider]  atorvastatin  (LIPITOR) 10 MG tablet Take 10 mg by mouth at bedtime. 07/21/20  Yes [provider]  Calcium Carbonate-Vitamin D (CALTRATE 600+D PO) Take 1 tablet by mouth every evening.    Yes [provider]  Carboxymethylcellul-Glycerin (LUBRICATING EYE DROPS OP) Place 1 drop into both eyes daily as needed (dry eyes).   Yes [provider]  carvedilol (COREG) 6.25 MG tablet Take 6.25 mg by mouth at bedtime. 04/12/20  Yes [provider]  Garlic 6644 MG CAPS Take 1,000 mg by mouth every other day.    Yes [provider]  hydrochlorothiazide (HYDRODIURIL) 25 MG tablet Take 25 mg by mouth daily. 05/05/20  Yes [provider]  omeprazole (PRILOSEC) 40 MG capsule TAKE 1 CAPSULE BY MOUTH TWICE A DAY Patient taking differently: Take 40 mg by mouth in the morning and at bedtime. 02/12/20  Yes Erenest Rasher, PA-C  atorvastatin (LIPITOR) 20 MG tablet Take 1 tablet (20 mg total) by mouth daily. Patient not taking: No sig reported 03/04/20   Frann Rider, NP    Allergies as of 07/15/2020  . (No Known Allergies)    Family History  Problem Relation Age of Onset  . Diabetes Father   . Heart disease Father   . Hyperlipidemia Father   . Hypertension Father   . Varicose Veins Father   . Hypertension Brother     Social History   Socioeconomic History  . Marital status: Widowed    Spouse name: Not on file  . Number of children: Not on file  . Years  of education: Not on file  . Highest education level: Not on file  Occupational History  . Not on file  Tobacco Use  . Smoking status: Never Smoker  . Smokeless tobacco: Never Used  Vaping Use  . Vaping Use: Never used  Substance and Sexual Activity  . Alcohol use: Yes    Alcohol/week: 1.0 standard drink    Types: 1 Glasses of wine per week    Comment: socially  . Drug use: No  . Sexual activity: Not on file  Other Topics Concern  . Not on file  Social History Narrative  . Not on file   Social  Determinants of Health   Financial Resource Strain: Not on file  Food Insecurity: Not on file  Transportation Needs: Not on file  Physical Activity: Not on file  Stress: Not on file  Social Connections: Not on file  Intimate Partner Violence: Not on file    Review of Systems: See HPI, otherwise negative ROS  Physical Exam: BP (!) 145/94   Pulse 63   Temp 98.3 F (36.8 C) (Oral)   Resp 16   Ht 5\' 7"  (1.702 m)   Wt 92.5 kg   SpO2 99%   BMI 31.95 kg/m  General:   Alert,  Well-developed, well-nourished, pleasant and cooperative in NAD Neck:  Supple; no masses or thyromegaly. No significant cervical adenopathy. Lungs:  Clear throughout to auscultation.   No wheezes, crackles, or rhonchi. No acute distress. Heart:  Regular rate and rhythm; no murmurs, clicks, rubs,  or gallops. Abdomen: Non-distended, normal bowel sounds.  Soft and nontender without appreciable mass or hepatosplenomegaly.  Pulses:  Normal pulses noted. Extremities:  Without clubbing or edema.  Impression/Plan: 70 year old lady here for updated colonoscopy.  Change in normal bowel habits to constipation.  More recently started on Amitiza.  Now on MiraLAX.  I have offered patient a diagnostic colonoscopy today. The risks, benefits, limitations, alternatives and imponderables have been reviewed with the patient. Questions have been answered. All parties are agreeable.  Difficulties previously elsewhere.  I issued the patient no guarantees I can complete examination today.     Notice: This dictation was prepared with Dragon dictation along with smaller phrase technology. Any transcriptional errors that result from this process are unintentional and may not be corrected upon review.

## 2020-09-20 NOTE — Anesthesia Postprocedure Evaluation (Signed)
Anesthesia Post Note  Patient: MIELA DESJARDIN  Procedure(s) Performed: COLONOSCOPY WITH PROPOFOL (N/A ) POLYPECTOMY  Patient location during evaluation: Phase II Anesthesia Type: General Level of consciousness: awake and alert and oriented Pain management: pain level controlled Vital Signs Assessment: post-procedure vital signs reviewed and stable Respiratory status: spontaneous breathing and respiratory function stable Cardiovascular status: blood pressure returned to baseline and stable Postop Assessment: no apparent nausea or vomiting Anesthetic complications: no   No complications documented.   Last Vitals:  Vitals:   09/20/20 0705 09/20/20 0806  BP: (!) 145/94 102/74  Pulse: 63 64  Resp: 16 18  Temp: 36.8 C 36.7 C  SpO2: 99% 97%    Last Pain:  Vitals:   09/20/20 0806  TempSrc: Oral  PainSc: 0-No pain                 Bridger Pizzi C Lavra Imler

## 2020-09-20 NOTE — Op Note (Signed)
[facility] Patient Name: Yesenia Bishop Procedure Date: 09/20/2020 7:18 AM MRN: [id number] Date of Birth: [birth year] Attending MD: Norvel Richards , MD CSN: [id number] Age: 70 Admit Type: Outpatient Procedure:                Colonoscopy Indications:              Change in bowel habits Providers:                Norvel Richards, MD, Janeece Riggers, RN, Raphael Gibney, Technician Referring MD:              Medicines:                Propofol per Anesthesia Complications:            No immediate complications. Estimated Blood Loss:     Estimated blood loss was minimal. Procedure:                Pre-Anesthesia Assessment:                           - Prior to the procedure, a History and Physical                            was performed, and patient medications and                            allergies were reviewed. The patient's tolerance of                            previous anesthesia was also reviewed. The risks                            and benefits of the procedure and the sedation                            options and risks were discussed with the patient.                            All questions were answered, and informed consent                            was obtained. Prior Anticoagulants: The patient has                            taken no previous anticoagulant or antiplatelet                            agents. ASA Grade Assessment: III - A patient with                            severe systemic disease. After reviewing the risks  and benefits, the patient was deemed in                            satisfactory condition to undergo the procedure.                           After obtaining informed consent, the colonoscope                            was passed under direct vision. Throughout the                            procedure, the patient's blood pressure, pulse, and                            oxygen saturations  were monitored continuously. The                            [id number]) scope was introduced through                            the anus and advanced to the the cecum, identified                            by appendiceal orifice and ileocecal valve. The                            colonoscopy was performed without difficulty. The                            patient tolerated the procedure well. The quality                            of the bowel preparation was adequate. Scope In: 7:37:21 AM Scope Out: 7:56:05 AM Scope Withdrawal Time: 0 hours 8 minutes 24 seconds  Total Procedure Duration: 0 hours 18 minutes 44 seconds  Findings:      The perianal and digital rectal examinations were normal. Colon was       elongated and redundant. External abdominal pressure required to reach       the cecum.      Two semi-pedunculated polyps were found in the ascending colon. The       polyps were 4 to 9 mm in size. These polyps were removed with a cold       snare. Resection and retrieval were complete. Estimated blood loss was       minimal.      The exam was otherwise without abnormality on direct and retroflexion       views. Impression:               - Two 4 to 9 mm polyps in the ascending colon,                            removed with a cold snare. Resected and retrieved.  Redundant colon.                           - The examination was otherwise normal on direct                            and retroflexion views. Moderate Sedation:      Moderate (conscious) sedation was personally administered by an       anesthesia professional. The following parameters were monitored: oxygen       saturation, heart rate, blood pressure, respiratory rate, EKG, adequacy       of pulmonary ventilation, and response to care. Recommendation:           - Patient has a contact number available for                            emergencies. The signs and symptoms of potential                             delayed complications were discussed with the                            patient. Return to normal activities tomorrow.                            Written discharge instructions were provided to the                            patient.                           - Resume previous diet.                           - Continue present medications.                           - Repeat colonoscopy date to be determined after                            pending pathology results are reviewed for                            surveillance.                           - Return to GI office (date not yet determined). Procedure Code(s):        --- Professional ---                           [id number], Colonoscopy, flexible; with removal of                            tumor(s), polyp(s), or other lesion(s) by snare  technique Diagnosis Code(s):        --- Professional ---                           K63.5, Polyp of colon                           R19.4, Change in bowel habit CPT copyright 2019 American Medical Association. All rights reserved. The codes documented in this report are preliminary and upon coder review may  be revised to meet current compliance requirements. Yesenia Bishop. Yesenia Skillin, MD Norvel Richards, MD 09/20/2020 8:47:00 AM This report has been signed electronically. Number of Addenda: 0

## 2020-09-20 NOTE — Discharge Instructions (Signed)
  Colonoscopy Discharge Instructions  Read the instructions outlined below and refer to this sheet in the next few weeks. These discharge instructions provide you with general information on caring for yourself after you leave the hospital. Your doctor may also give you specific instructions. While your treatment has been planned according to the most current medical practices available, unavoidable complications occasionally occur. If you have any problems or questions after discharge, call Dr. Gala Romney at [phone]. ACTIVITY  You may resume your regular activity, but move at a slower pace for the next 24 hours.   Take frequent rest periods for the next 24 hours.   Walking will help get rid of the air and reduce the bloated feeling in your belly (abdomen).   No driving for 24 hours (because of the medicine (anesthesia) used during the test).    Do not sign any important legal documents or operate any machinery for 24 hours (because of the anesthesia used during the test).  NUTRITION  Drink plenty of fluids.   You may resume your normal diet as instructed by your doctor.   Begin with a light meal and progress to your normal diet. Heavy or fried foods are harder to digest and may make you feel sick to your stomach (nauseated).   Avoid alcoholic beverages for 24 hours or as instructed.  MEDICATIONS  You may resume your normal medications unless your doctor tells you otherwise.  WHAT YOU CAN EXPECT TODAY  Some feelings of bloating in the abdomen.   Passage of more gas than usual.   Spotting of blood in your stool or on the toilet paper.  IF YOU HAD POLYPS REMOVED DURING THE COLONOSCOPY:  No aspirin products for 7 days or as instructed.   No alcohol for 7 days or as instructed.   Eat a soft diet for the next 24 hours.  FINDING OUT THE RESULTS OF YOUR TEST Not all test results are available during your visit. If your test results are not back during the visit, make an appointment  with your caregiver to find out the results. Do not assume everything is normal if you have not heard from your caregiver or the medical facility. It is important for you to follow up on all of your test results.  SEEK IMMEDIATE MEDICAL ATTENTION IF:  You have more than a spotting of blood in your stool.   Your belly is swollen (abdominal distention).   You are nauseated or vomiting.   You have a temperature over 101.   You have abdominal pain or discomfort that is severe or gets worse throughout the day.   I was able to complete your colonoscopy today  You had 2 polyps in your colon on the right side (not seen previously).  They were removed.  Further recommendations to follow pending review of pathology report  As a totally separate issue, as discussed with you prior to procedure, follow-up with Dr. 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

## 2020-09-20 NOTE — Anesthesia Preprocedure Evaluation (Signed)
Anesthesia Evaluation  Patient identified by MRN, date of birth, ID band Patient awake    Reviewed: Allergy & Precautions, NPO status , Patient's Chart, lab work & pertinent test results, reviewed documented beta blocker date and time   History of Anesthesia Complications Negative for: history of anesthetic complications  Airway Mallampati: II  TM Distance: >3 FB Neck ROM: Full    Dental  (+) Dental Advisory Given, Missing   Pulmonary neg pulmonary ROS,    Pulmonary exam normal breath sounds clear to auscultation       Cardiovascular Exercise Tolerance: Good hypertension, Pt. on medications and Pt. on home beta blockers + Past MI  Normal cardiovascular exam Rhythm:Regular Rate:Normal     Neuro/Psych CVA, No Residual Symptoms negative psych ROS   GI/Hepatic Neg liver ROS, GERD  Medicated and Controlled,  Endo/Other  negative endocrine ROS  Renal/GU Renal InsufficiencyRenal disease  negative genitourinary   Musculoskeletal negative musculoskeletal ROS (+)   Abdominal   Peds negative pediatric ROS (+)  Hematology negative hematology ROS (+)   Anesthesia Other Findings   Reproductive/Obstetrics negative OB ROS                           Anesthesia Physical Anesthesia Plan  ASA: III  Anesthesia Plan: General   Post-op Pain Management:    Induction: Intravenous  PONV Risk Score and Plan: Propofol infusion  Airway Management Planned: Nasal Cannula and Natural Airway  Additional Equipment:   Intra-op Plan:   Post-operative Plan:   Informed Consent: I have reviewed the patients History and Physical, chart, labs and discussed the procedure including the risks, benefits and alternatives for the proposed anesthesia with the patient or authorized representative who has indicated his/her understanding and acceptance.     Dental advisory given  Plan Discussed with: CRNA and  Surgeon  Anesthesia Plan Comments:        Anesthesia Quick Evaluation

## 2020-09-20 NOTE — Anesthesia Procedure Notes (Signed)
Date/Time: 09/20/2020 7:38 AM Performed by: Orlie Dakin, CRNA Pre-anesthesia Checklist: Patient identified, Emergency Drugs available, Suction available and Patient being monitored Patient Re-evaluated:Patient Re-evaluated prior to induction Oxygen Delivery Method: Nasal cannula Induction Type: IV induction Placement Confirmation: positive ETCO2

## 2020-09-20 NOTE — Transfer of Care (Signed)
Immediate Anesthesia Transfer of Care Note  Patient: Yesenia Bishop  Procedure(s) Performed: COLONOSCOPY WITH PROPOFOL (N/A ) POLYPECTOMY  Patient Location: Short Stay  Anesthesia Type:General  Level of Consciousness: awake, alert  and oriented  Airway & Oxygen Therapy: Patient Spontanous Breathing  Post-op Assessment: Report given to RN, Post -op Vital signs reviewed and stable, Patient moving all extremities and Patient moving all extremities X 4  Post vital signs: Reviewed and stable  Last Vitals:  Vitals Value Taken Time  BP    Temp    Pulse    Resp    SpO2      Last Pain:  Vitals:   09/20/20 0733  TempSrc:   PainSc: 0-No pain         Complications: No complications documented.

## 2020-09-21 ENCOUNTER — Encounter: Payer: Self-pay | Admitting: Internal Medicine

## 2020-09-21 LAB — SURGICAL PATHOLOGY

## 2020-09-22 ENCOUNTER — Encounter (HOSPITAL_COMMUNITY): Payer: Self-pay | Admitting: Internal Medicine

## 2020-11-24 ENCOUNTER — Telehealth: Payer: Self-pay | Admitting: Internal Medicine

## 2020-11-24 NOTE — Telephone Encounter (Signed)
Recall for mri abdomen

## 2020-11-24 NOTE — Telephone Encounter (Signed)
Recall mailed 

## 2020-11-25 ENCOUNTER — Telehealth: Payer: Self-pay | Admitting: Internal Medicine

## 2020-11-25 ENCOUNTER — Encounter: Payer: Self-pay | Admitting: Internal Medicine

## 2020-11-25 NOTE — Telephone Encounter (Signed)
RECALL FOR MRI ABDOMEN

## 2020-11-25 NOTE — Telephone Encounter (Signed)
Duplicate message. 

## 2020-12-21 ENCOUNTER — Telehealth: Payer: Self-pay | Admitting: Internal Medicine

## 2020-12-21 DIAGNOSIS — E279 Disorder of adrenal gland, unspecified: Secondary | ICD-10-CM

## 2020-12-21 NOTE — Telephone Encounter (Signed)
Per 04/28/20 OV with Neil Crouch: "Plan MRI abdomen with and without contrast in July 2022 for surveillance"  MRI scheduled for 8/9, arrival 10:30am for 11:00am appt. Nothing to eat or drink after midnight.  Called pt and made aware of appt details. She voiced understanding. She now has [employer]. ID# [id number]  PA submitted via [employer] for [employer]. PA pending review. Case# [id number]. Clinicals uploaded

## 2020-12-21 NOTE — Telephone Encounter (Signed)
Pt received letter that it was time to schedule her MRI. Please call [phone]

## 2020-12-27 NOTE — Telephone Encounter (Signed)
PA approved. Authorization Number: [id number], Authorization Valid from/to: 12/24/2020 - 06/22/2021

## 2021-01-04 ENCOUNTER — Other Ambulatory Visit: Payer: Self-pay

## 2021-01-04 ENCOUNTER — Ambulatory Visit (HOSPITAL_COMMUNITY)
Admission: RE | Admit: 2021-01-04 | Discharge: 2021-01-04 | Disposition: A | Discharge status: Home / Self Care | Payer: Medicare HMO | Source: Ambulatory Visit | Attending: Gastroenterology | Admitting: Gastroenterology

## 2021-01-04 DIAGNOSIS — E279 Disorder of adrenal gland, unspecified: Secondary | ICD-10-CM | POA: Insufficient documentation

## 2021-01-04 IMAGING — MR MR ABDOMEN WO/W CM
21 series · 48 of 48 positions shown · IV contrast (10 ml Gadavist)
Comparison: CT [DATE].

CLINICAL DATA: Follow-up left adrenal lesion.

EXAM:
MRI ABDOMEN WITHOUT AND WITH CONTRAST
TECHNIQUE: Multiplanar multisequence MR imaging of the abdomen was performed
both before and after the administration of intravenous contrast.
CONTRAST:  10mL GADAVIST GADOBUTROL 1 MMOL/ML IV SOLN

[Series 3: cor haste · coronal · 6.0mm · 1.25mm/px · 1 of 30 slices shown]
[im 1/30]
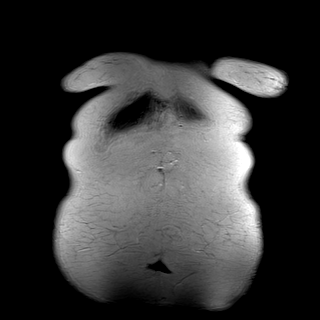

[Series 4: ax haste · axial · 6.0mm · 1.38mm/px · 1 of 38 slices shown]
[im 1/38]
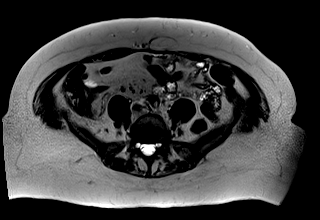

[Series 5: T2 fat-sat · axial · 6.0mm · 1.19mm/px · 1 of 30 slices shown]
[im 1/30]
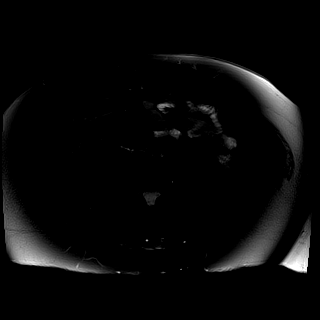

[Series 6: DWI · axial · 6.0mm · 1.64mm/px · 1 of 46 slices shown (1 of 4)]
[im 1/46]
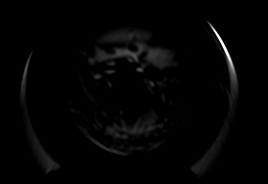

[Series 6: DWI · axial · 6.0mm · 1.64mm/px · 1 of 46 slices shown (2 of 4)]
[im 1/46]
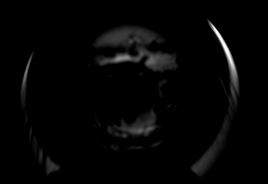

[Series 6: DWI · axial · 6.0mm · 1.64mm/px · 1 of 46 slices shown (3 of 4)]
[im 1/46]
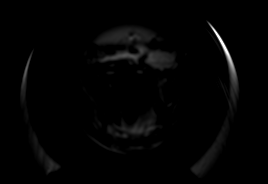

[Series 7: DWI · axial · 6.0mm · 1.64mm/px · 1 of 46 slices shown (4 of 4)]
[im 1/46]
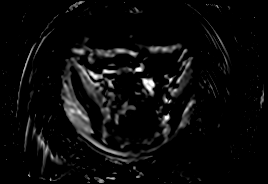

[Series 8: ax in and · axial · 3.0mm · 1.19mm/px · z∈[-58,+155]mm · 2 of 72 slices shown (1 of 2)]
[im 1/72]
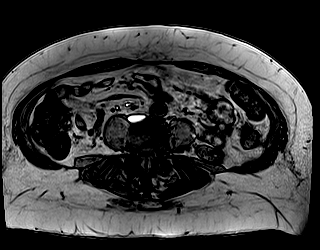
[im 72/72]
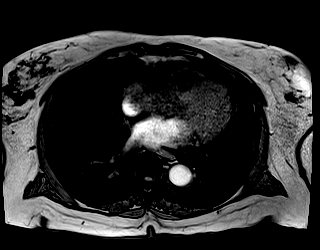

[Series 9: ax in and · axial · 3.0mm · 1.19mm/px · z∈[-58,+155]mm · 3 of 72 slices shown (2 of 2)]
[im 1/72]
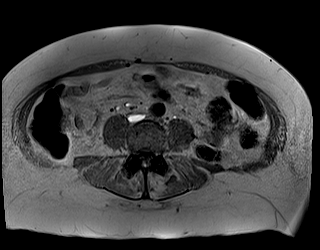
[im 36/72]
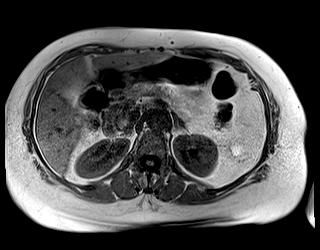
[im 72/72]
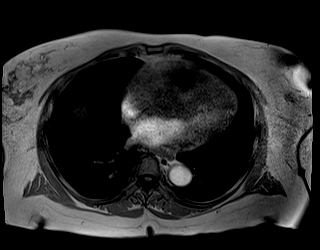

[Series 10: cor in-opp_opp · coronal · 3.0mm · 1.19mm/px · 3 of 72 slices shown]
[im 1/72]
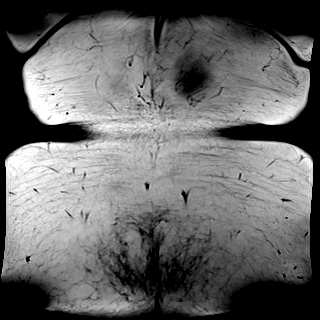
[im 36/72]
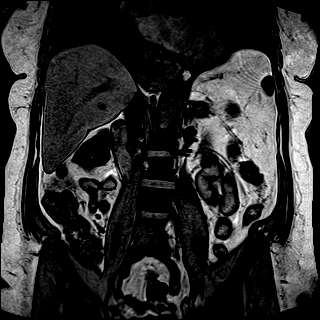
[im 72/72]
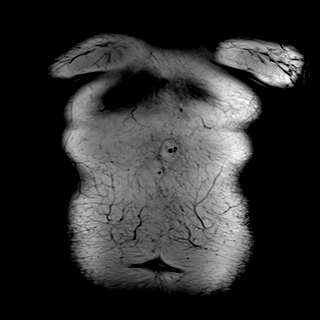

[Series 11: cor in-opp_in · coronal · 3.0mm · 1.19mm/px · 3 of 72 slices shown]
[im 1/72]
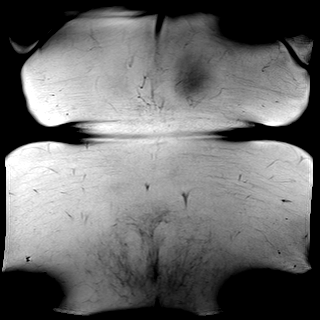
[im 36/72]
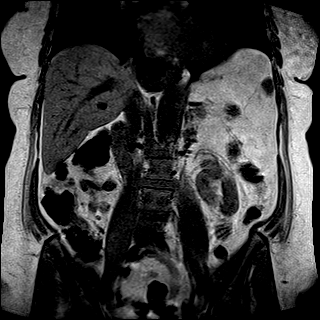
[im 72/72]
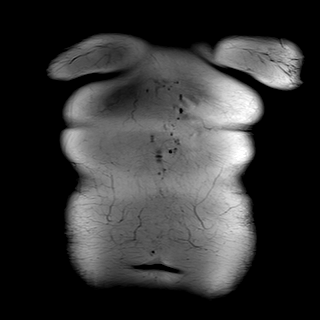

[Series 12: t1_vibe_fs_tra_p4_bh_pre · axial · 3.0mm · 1.19mm/px · z∈[-64,+149]mm · 3 of 72 slices shown]
[im 1/72]
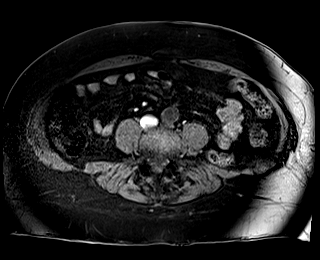
[im 36/72]
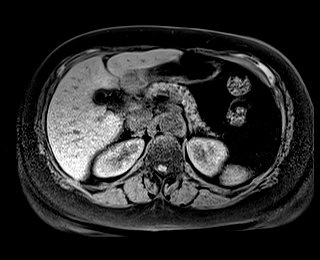
[im 72/72]
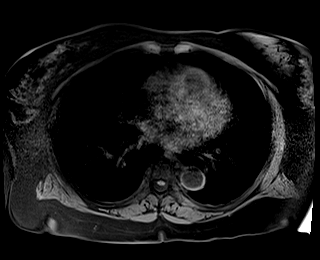

[Series 14: t1_vibe_fs_tra_p4_bh_post · axial · 3.0mm · 1.19mm/px · z∈[-64,+149]mm · 3 of 72 slices shown (1 of 4)]
[im 1/72]
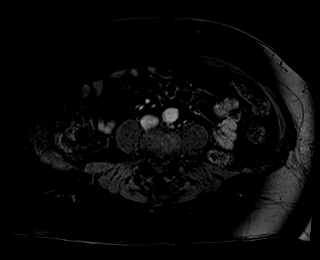
[im 36/72]
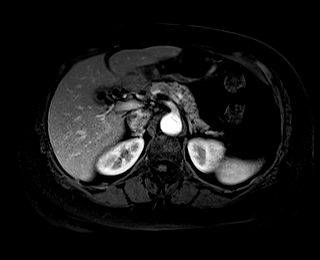
[im 72/72]
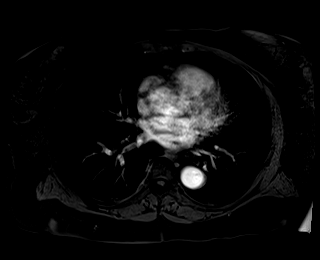

[Series 15: t1_vibe_fs_tra_p4_bh_post_sub · axial · 3.0mm · 1.19mm/px · z∈[-64,+149]mm · 3 of 72 slices shown (1 of 4)]
[im 1/72]
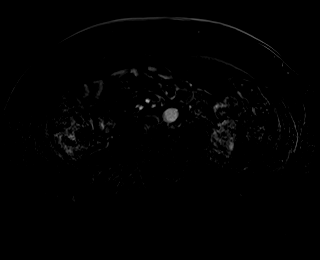
[im 36/72]
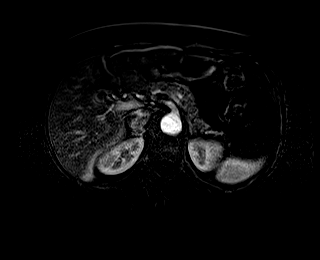
[im 72/72]
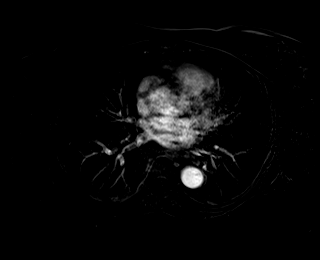

[Series 16: t1_vibe_fs_tra_p4_bh_post · axial · 3.0mm · 1.19mm/px · z∈[-64,+149]mm · 3 of 72 slices shown (2 of 4)]
[im 1/72]
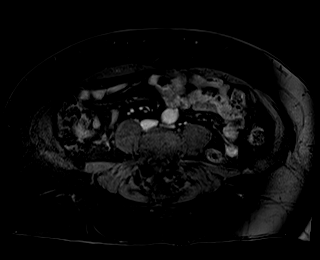
[im 36/72]
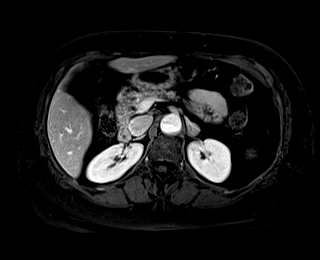
[im 72/72]
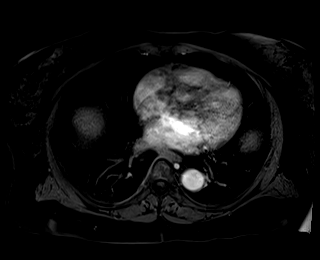

[Series 17: t1_vibe_fs_tra_p4_bh_post_sub · axial · 3.0mm · 1.19mm/px · z∈[-64,+149]mm · 3 of 72 slices shown (2 of 4)]
[im 1/72]
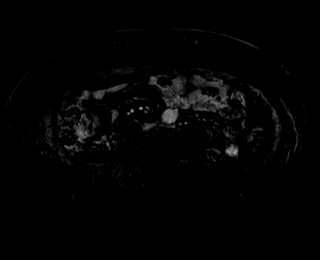
[im 36/72]
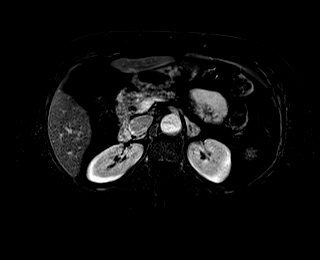
[im 72/72]
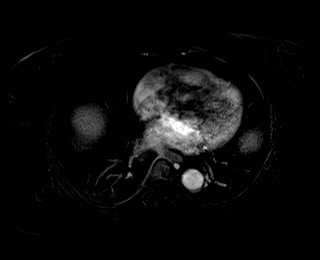

[Series 18: t1_vibe_fs_tra_p4_bh_post · axial · 3.0mm · 1.19mm/px · z∈[-64,+149]mm · 3 of 72 slices shown (3 of 4)]
[im 1/72]
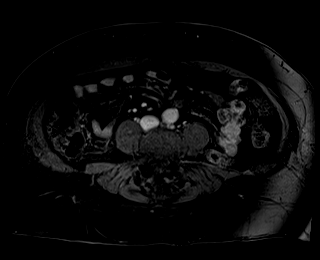
[im 36/72]
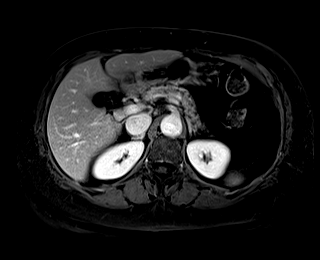
[im 72/72]
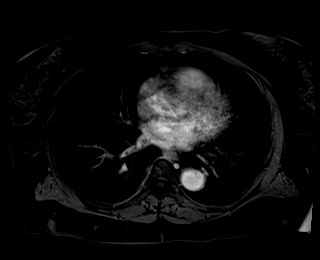

[Series 19: t1_vibe_fs_tra_p4_bh_post_sub · axial · 3.0mm · 1.19mm/px · z∈[-64,+149]mm · 3 of 72 slices shown (3 of 4)]
[im 1/72]
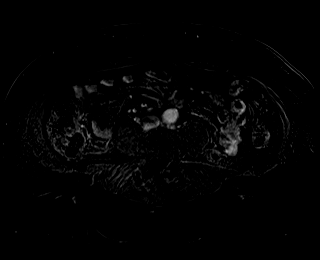
[im 36/72]
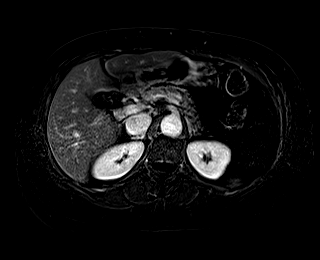
[im 72/72]
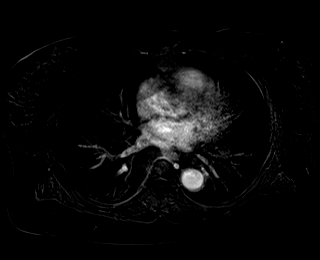

[Series 20: t1_vibe_fs_tra_p4_bh_post · axial · 3.0mm · 1.19mm/px · z∈[-64,+149]mm · 3 of 72 slices shown (4 of 4)]
[im 1/72]
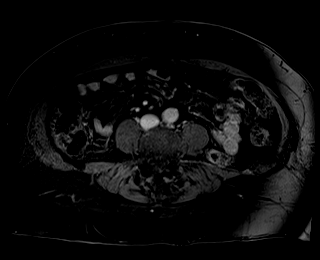
[im 36/72]
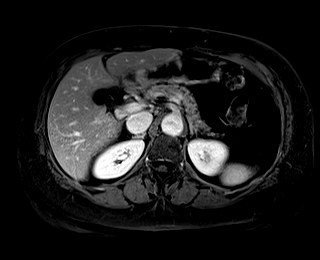
[im 72/72]
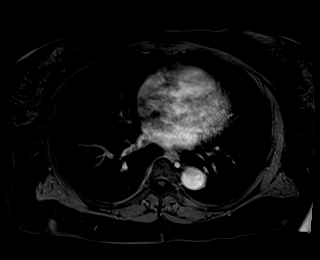

[Series 21: t1_vibe_fs_tra_p4_bh_post_sub · axial · 3.0mm · 1.19mm/px · z∈[-64,+149]mm · 3 of 72 slices shown (4 of 4)]
[im 1/72]
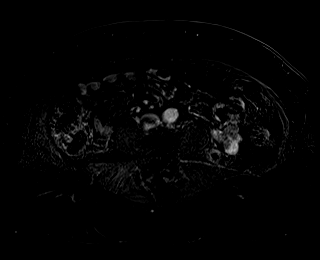
[im 36/72]
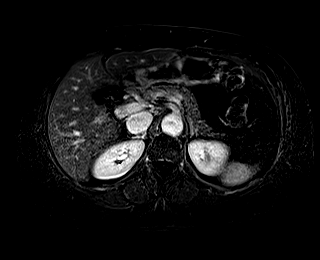
[im 72/72]
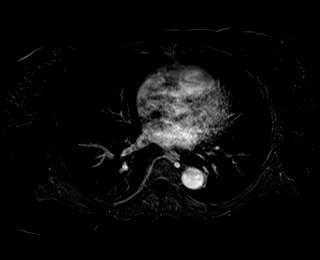

[Series 22: T1 dynamic post-contrast · coronal · 3.0mm · 1.31mm/px · 3 of 72 slices shown]
[im 1/72]
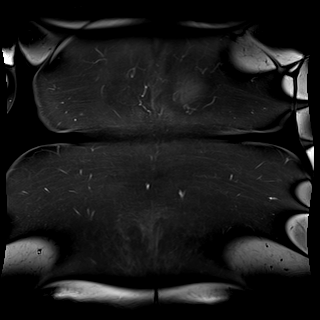
[im 36/72]
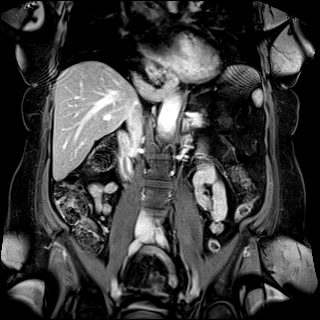
[im 72/72]
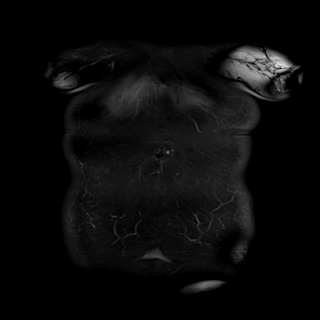

[48 of 48 positions shown; findings below may reference images not displayed]

FINDINGS: Lower chest: No acute abnormality.  Moderate size hiatal hernia.

Hepatobiliary: Mild diffuse hepatic steatosis. No solid enhancing
hepatic lesion. Gallbladder is surgically absent. No biliary ductal
dilation.

Pancreas:  Within normal limits.

Spleen:  Within normal limits.

Adrenals/Urinary Tract: Unchanged size of the enhancing 1.6 cm left
adrenal nodule which demonstrates loss of signal on out of phase
imaging. Right adrenal gland is unremarkable. No hydronephrosis. No
solid enhancing renal mass.

Stomach/Bowel: Moderate hiatal hernia. Otherwise visualized portions
of bowel within the abdomen are unremarkable.

Vascular/Lymphatic: No pathologically enlarged lymph nodes
identified. No abdominal aortic aneurysm demonstrated.

Other:  No abdominopelvic ascites.

Musculoskeletal: No suspicious bone lesions identified.
IMPRESSION: 1. Unchanged size of the enhancing 1.6 cm left adrenal nodule with
imaging characteristics most consistent with a benign adrenal
adenoma. Consider correlation with biochemical laboratory values to
assess for functionality.
2. Mild diffuse hepatic steatosis.
3. Moderate size hiatal hernia.

## 2021-01-04 MED ORDER — GADOBUTROL 1 MMOL/ML IV SOLN
10.0000 mL | Freq: Once | INTRAVENOUS | Status: AC | PRN
  Administered 2021-01-04: 10 mL via INTRAVENOUS

## 2021-01-07 ENCOUNTER — Other Ambulatory Visit: Payer: Self-pay | Admitting: *Deleted

## 2021-01-07 DIAGNOSIS — E279 Disorder of adrenal gland, unspecified: Secondary | ICD-10-CM

## 2021-01-17 ENCOUNTER — Encounter: Payer: Self-pay | Admitting: Internal Medicine

## 2021-01-26 ENCOUNTER — Other Ambulatory Visit: Payer: Self-pay | Admitting: Internal Medicine

## 2021-01-26 DIAGNOSIS — Z139 Encounter for screening, unspecified: Secondary | ICD-10-CM

## 2021-01-28 ENCOUNTER — Ambulatory Visit
Admission: RE | Admit: 2021-01-28 | Discharge: 2021-01-28 | Disposition: A | Discharge status: Home / Self Care | Payer: Medicare HMO | Source: Ambulatory Visit | Attending: Internal Medicine | Admitting: Internal Medicine

## 2021-01-28 ENCOUNTER — Other Ambulatory Visit: Payer: Self-pay

## 2021-01-28 DIAGNOSIS — Z139 Encounter for screening, unspecified: Secondary | ICD-10-CM

## 2021-01-28 IMAGING — MG MM DIGITAL SCREENING BILAT W/ TOMO AND CAD
8 series · 8 of 24 positions shown · non-contrast
Comparison: Previous exam(s).

CLINICAL DATA: Screening.

EXAM:
DIGITAL SCREENING BILATERAL MAMMOGRAM WITH TOMOSYNTHESIS AND CAD
TECHNIQUE: Bilateral screening digital craniocaudal and mediolateral oblique
mammograms were obtained. Bilateral screening digital breast
tomosynthesis was performed. The images were evaluated with
computer-aided detection.

[R MLO synth-2D]
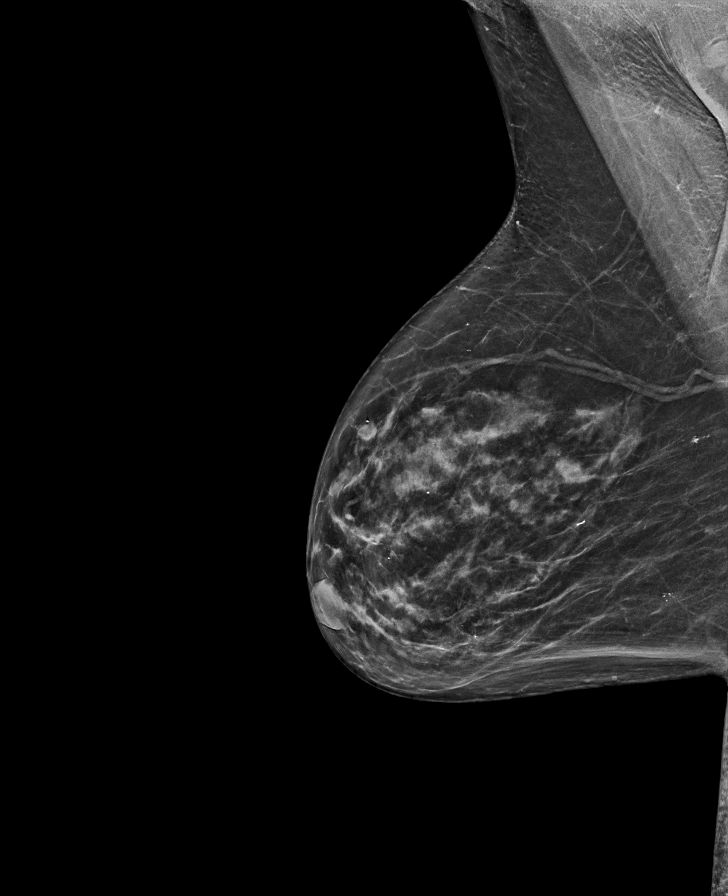

[L MLO synth-2D]
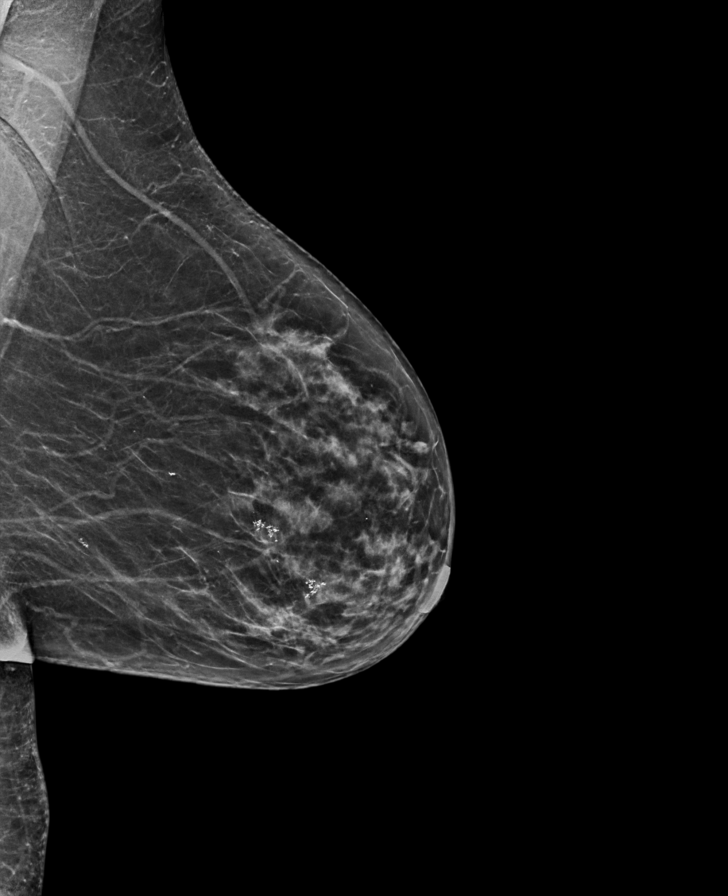

[L CC synth-2D]
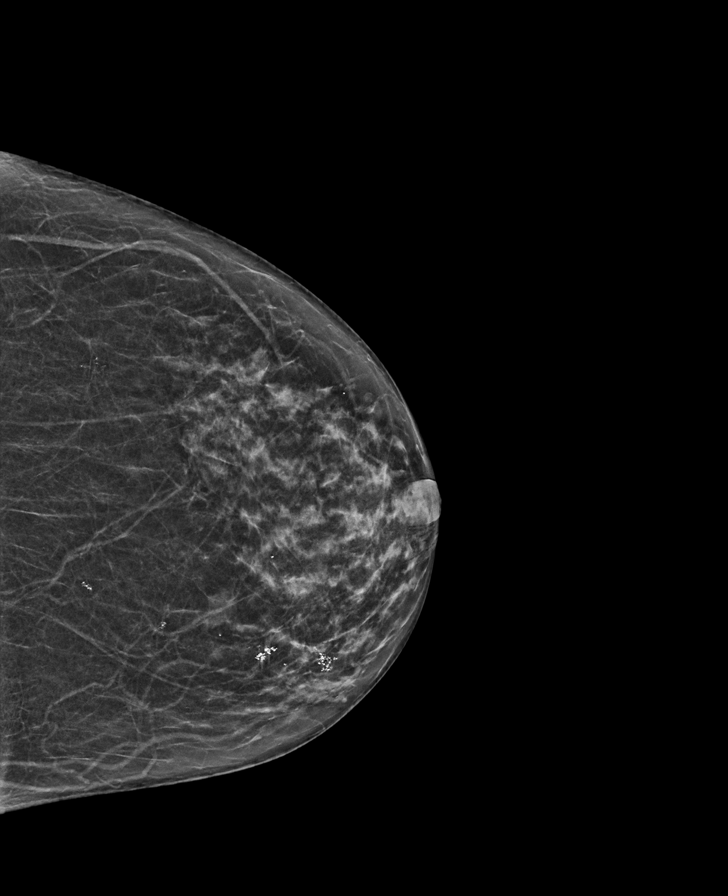

[R CC synth-2D]
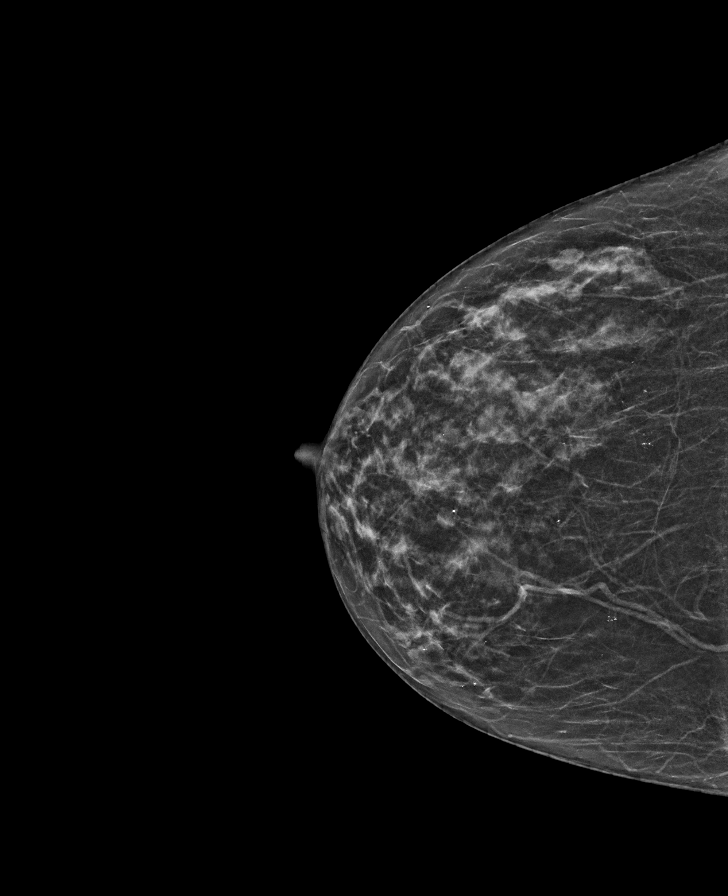

[L MLO tomo · tomo slice 32/63.0]
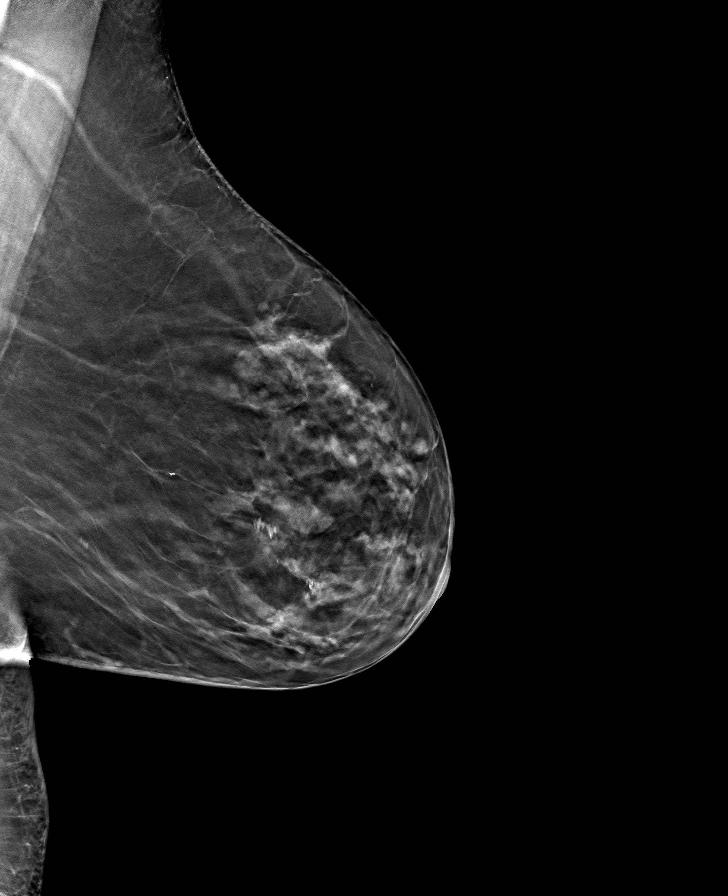

[L CC tomo · tomo slice 27/54.0]
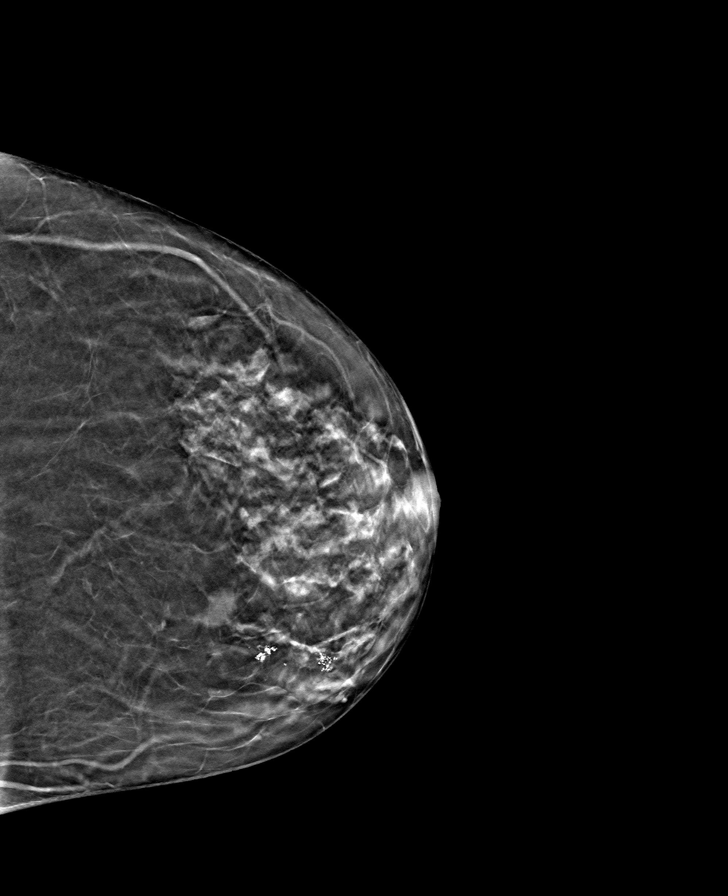

[R MLO tomo · tomo slice 34/67.0]
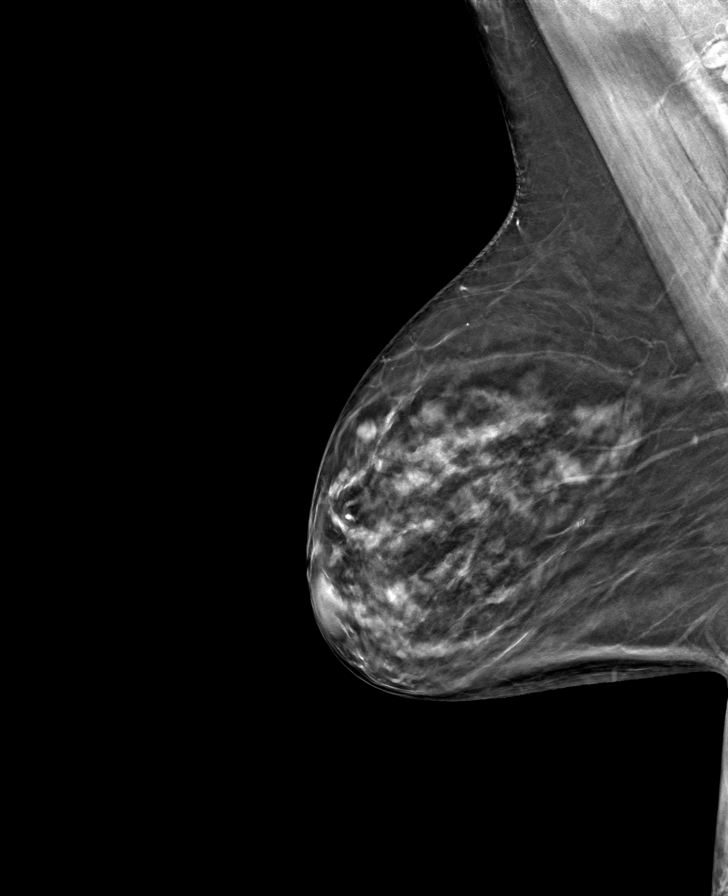

[R CC tomo · tomo slice 27/53.0]
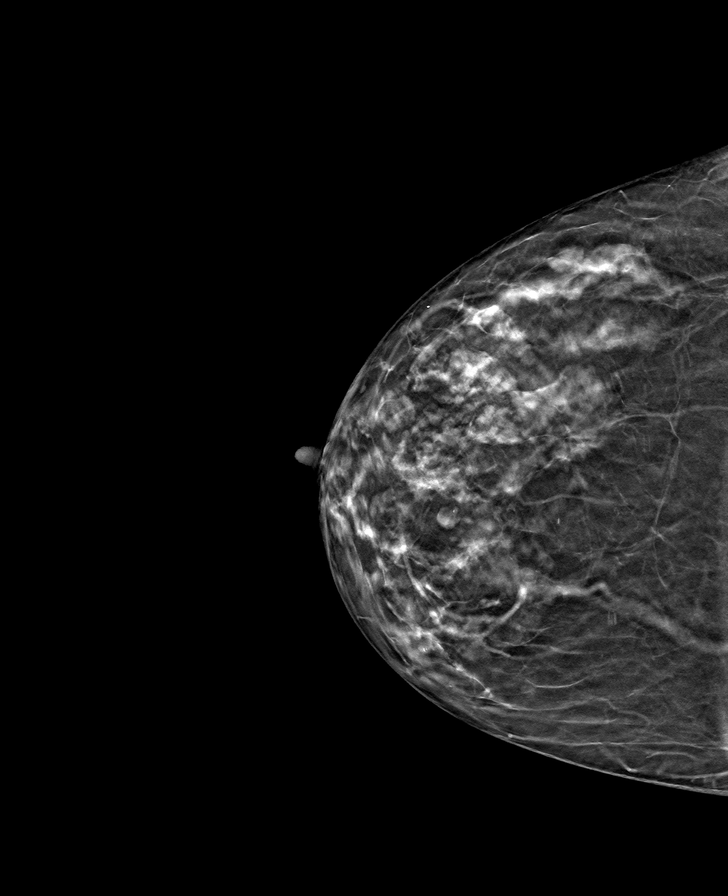

[8 of 24 positions shown; findings below may reference images not displayed]

ACR Breast Density Category b: There are scattered areas of
fibroglandular density.
FINDINGS: There are no findings suspicious for malignancy.
IMPRESSION: No mammographic evidence of malignancy. A result letter of this
screening mammogram will be mailed directly to the patient.

RECOMMENDATION:
Screening mammogram in one year. (Code:[BY])

BI-RADS CATEGORY  1: Negative.

## 2021-02-01 ENCOUNTER — Ambulatory Visit: Payer: Medicare Other | Admitting: Adult Health

## 2021-02-16 ENCOUNTER — Encounter: Payer: Self-pay | Admitting: "Endocrinology

## 2021-02-16 ENCOUNTER — Ambulatory Visit: Payer: Medicare HMO | Admitting: "Endocrinology

## 2021-02-16 ENCOUNTER — Other Ambulatory Visit: Payer: Self-pay

## 2021-02-16 VITALS — BP 142/84 | HR 68 | Ht 67.0 in | Wt 205.0 lb

## 2021-02-16 DIAGNOSIS — I10 Essential (primary) hypertension: Secondary | ICD-10-CM | POA: Diagnosis not present

## 2021-02-16 DIAGNOSIS — D3502 Benign neoplasm of left adrenal gland: Secondary | ICD-10-CM | POA: Diagnosis not present

## 2021-02-16 NOTE — Progress Notes (Signed)
Endocrinology Consult Note                                            02/16/2021, 12:31 PM   Subjective:    Patient ID: Yesenia Bishop, female    DOB: 1951-04-29, PCP Monico Blitz, MD   Past Medical History:  Diagnosis Date   GERD (gastroesophageal reflux disease)    HTN (hypertension)    Mixed hyperlipidemia 08/28/2018   Myocardial infarction 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 Sherrlyn Hock: Excellent..  Unable to navigate proximal to the hepatic flexure.  Otherwise visualized colon was normal.  Consider propofol, exam following up with barium enema.   COLONOSCOPY WITH PROPOFOL N/A 09/20/2020   Procedure: COLONOSCOPY WITH PROPOFOL;  Surgeon: Daneil Dolin, MD;  Location: [facility];  Service: Endoscopy;  Laterality: N/A;  AM   ESOPHAGOGASTRODUODENOSCOPY N/A 08/13/2019   Rourk: Esophagus normal, status post dilation for history of dysphagia.  Large hiatal hernia.  Gastric polyps removed, hyperplastic.   LEFT HEART CATH AND CORONARY ANGIOGRAPHY N/A 08/28/2018   Procedure: LEFT HEART CATH AND CORONARY ANGIOGRAPHY;  Surgeon: Adrian Prows, MD;  Location: [facility];  Service: Cardiovascular;  Laterality: N/A;   MALONEY DILATION N/A 08/13/2019   Procedure: Venia Minks DILATION;  Surgeon: Daneil Dolin, MD;  Location: [facility];  Service: Endoscopy;  Laterality: N/A;   POLYPECTOMY  09/20/2020   Procedure: POLYPECTOMY;  Surgeon: Daneil Dolin, MD;  Location: [facility];  Service: Endoscopy;;   SHOULDER SURGERY Right    TUBAL LIGATION     Social History   Socioeconomic History   Marital status: Widowed    Spouse name: Not on file    Number of children: Not on file   Years of education: Not on file   Highest education level: Not on file  Occupational History   Not on file  Tobacco Use   Smoking status: Never   Smokeless tobacco: Never  Vaping Use   Vaping Use: Never used  Substance and Sexual Activity   Alcohol use: Not Currently    Alcohol/week: 1.0 standard drink    Types: 1 Glasses of wine per week    Comment: socially   Drug use: No   Sexual activity: Not on file  Other Topics Concern   Not on file  Social History Narrative   Not on file   Social Determinants of Health   Financial Resource Strain: Not on file  Food Insecurity: Not on file  Transportation Needs: Not on file  Physical Activity: Not on file  Stress: Not on file  Social Connections: Not on file   Family History  Problem Relation Age of Onset   Diabetes Father    Heart disease Father    Hyperlipidemia Father    Hypertension Father    Varicose Veins Father    Hypertension Brother    Breast cancer Neg Hx    Outpatient Encounter  Medications as of 02/16/2021  Medication Sig   Calcium Carbonate-Vitamin D (CALTRATE 600+D PO) Take 1 tablet by mouth every evening.    amLODipine-olmesartan (AZOR) 10-40 MG tablet Take 1 tablet by mouth every evening.    atorvastatin (LIPITOR) 10 MG tablet Take 10 mg by mouth at bedtime.   Carboxymethylcellul-Glycerin (LUBRICATING EYE DROPS OP) Place 1 drop into both eyes daily as needed (dry eyes).   carvedilol (COREG) 6.25 MG tablet Take 6.25 mg by mouth at bedtime.   Garlic 6761 MG CAPS Take 1,000 mg by mouth every other day.  (Patient not taking: Reported on 02/16/2021)   hydrochlorothiazide (HYDRODIURIL) 25 MG tablet Take 25 mg by mouth daily.   omeprazole (PRILOSEC) 40 MG capsule TAKE 1 CAPSULE BY MOUTH TWICE A DAY (Patient taking differently: Take 40 mg by mouth in the morning and at bedtime.)   No facility-administered encounter medications on file as of 02/16/2021.   ALLERGIES: No Known  Allergies  VACCINATION STATUS:  There is no immunization history on file for this patient.  HPI Yesenia Bishop is 70 y.o. female who presents today with a medical history as above. she is being seen in consultation for left adrenal adenoma requested by Monico Blitz, MD.  History is obtained directly from the patient as well as chart review.  She denies any prior history of adrenal dysfunction.  In July 2021 she was found to have an incidental finding of 1.7 cm left adrenal lesion which was poorly characterized.  Repeat imaging of MRI on January 04, 2021 commented more detail about this adrenal lesion to be 1.6 cm likely benign.  Patient has hypertension on 3 medications.  She denies any paroxysms of palpitations, headaches, nor diaphoresis.  She reports steady weight gain, however no recent rapid changes. Patient denies any family history of adrenal, thyroid, nor parathyroid dysfunction. -She did not have any adrenal functional studies yet.  She currently has no complaint of abdominal pain.  Her medical history also includes hyperlipidemia on treatment, A1c of 6.2% in 2021 consistent with prediabetes, not on treatment.  Review of Systems  Constitutional: + Steady weight, no fatigue, no subjective hyperthermia, no subjective hypothermia Eyes: no blurry vision, no xerophthalmia ENT: no sore throat, no nodules palpated in throat, no dysphagia/odynophagia, no hoarseness Cardiovascular: no Chest Pain, no Shortness of Breath, no palpitations, no leg swelling Respiratory: no cough, no shortness of breath Gastrointestinal: no Nausea/Vomiting/Diarhhea Musculoskeletal: no muscle/joint aches Skin: no rashes Neurological: no tremors, no numbness, no tingling, no dizziness Psychiatric: no depression, no anxiety  Objective:    Vitals with BMI 02/16/2021 09/20/2020 09/20/2020  Height 5\' 7"  - 5\' 7"   Weight 205 lbs - 204 lbs  BMI 95.0 - 93.26  Systolic 712 458 099  Diastolic 84 74 94  Pulse 68 64 63     BP (!) 142/84   Pulse 68   Ht 5\' 7"  (1.702 m)   Wt 205 lb (93 kg)   BMI 32.11 kg/m   Wt Readings from Last 3 Encounters:  02/16/21 205 lb (93 kg)  09/20/20 204 lb (92.5 kg)  09/16/20 207 lb (93.9 kg)    Physical Exam  Constitutional:  Body mass index is 32.11 kg/m.,  not in acute distress, normal state of mind Eyes: PERRLA, EOMI, no exophthalmos ENT: moist mucous membranes, no gross thyromegaly, no gross cervical lymphadenopathy Cardiovascular: normal precordial activity, Regular Rate and Rhythm, no Murmur/Rubs/Gallops Respiratory:  adequate breathing efforts, no gross chest deformity, Clear to auscultation bilaterally Gastrointestinal: abdomen  soft, Non -tender, No distension, Bowel Sounds present, no gross organomegaly Musculoskeletal: + Bilateral lower extremity gross arthritic changes with mild edema at the ankles,  strength intact in all four extremities Skin: moist, warm, no rashes Neurological: no tremor with outstretched hands, Deep tendon reflexes normal in bilateral lower extremities.  CMP ( most recent) CMP     Component Value Date/Time   NA 138 09/16/2020 1148   K 3.8 09/16/2020 1148   CL 102 09/16/2020 1148   CO2 27 09/16/2020 1148   GLUCOSE 94 09/16/2020 1148   BUN 22 09/16/2020 1148   CREATININE 0.90 09/16/2020 1148   CREATININE 0.87 11/19/2019 1352   CALCIUM 9.8 09/16/2020 1148   PROT 7.8 01/27/2020 0125   ALBUMIN 3.9 01/27/2020 0125   AST 25 01/27/2020 0125   ALT 16 01/27/2020 0125   ALKPHOS 76 01/27/2020 0125   BILITOT <0.1 (L) 01/27/2020 0125   GFRNONAA >60 09/16/2020 1148   GFRAA 55 (L) 01/27/2020 0125     Diabetic Labs (most recent): Lab Results  Component Value Date   HGBA1C 6.2 (H) 01/27/2020     Lipid Panel ( most recent) Lipid Panel     Component Value Date/Time   CHOL 176 01/28/2020 0707   TRIG 86 01/28/2020 0707   HDL 62 01/28/2020 0707   CHOLHDL 2.8 01/28/2020 0707   VLDL 17 01/28/2020 0707   LDLCALC 97 01/28/2020 0707    December 12, 2019 CT scan: Adrenals/Urinary Tract: 1.7 cm homogeneous left adrenal mass is seen, which has nonspecific characteristics. No renal masses identified. No evidence of ureteral calculi or hydronephrosis. Unremarkable unopacified urinary bladder.   January 04, 2021 abdominal MRI: IMPRESSION: 1. Unchanged size of the enhancing 1.6 cm left adrenal nodule with imaging characteristics most consistent with a benign adrenal adenoma. Consider correlation with biochemical laboratory values to assess for functionality. 2. Mild diffuse hepatic steatosis. 3. Moderate size hiatal hernia.  Assessment & Plan:   1. Adrenal adenoma, left 2. Essential hypertension, benign   - DANYLAH HOLDEN  is being seen at a kind request of Monico Blitz, MD. - I have reviewed and discussed with her her available endocrine records and clinically evaluated the patient. - Based on these reviews, she has left adrenal adenoma which is described as likely benign adenoma.  It is likely nonfunctional, however she does not have functional studies as of yet.  She will be considered for basic endocrine evaluation including 24-hour urine collection for measurement of metanephrines, catecholamines, cortisol, and aldosterone.  -Her blood pressure is controlled on current regimen and involving amlodipine/olmesartan, carvedilol and hydrochlorothiazide.  She is advised to continue.  Her next option will depend on lab findings. - I did not initiate any new prescriptions today. - she is advised to maintain close follow up with Monico Blitz, MD for primary care needs.   - Time spent with the patient: 60 minutes, of which >50% was spent in  counseling her about her left adrenal adenoma, hypertension and the rest in obtaining information about her symptoms, reviewing her previous labs/studies ( including abstractions from other facilities),  evaluations, and treatments,  and developing a plan to confirm diagnosis and long term  treatment based on the latest standards of care/guidelines; and documenting her care.  Yesenia Bishop participated in the discussions, expressed understanding, and voiced agreement with the above plans.  All questions were answered to her satisfaction. she is encouraged to contact clinic should she have any questions or concerns prior to her return  visit.  Follow up plan: Return in about 2 weeks (around 03/02/2021), or 24 hour urine metaneph, catechol, for F/U with Pre-visit Labs, 24 Hr Urine Free Cortisol & [place], MD [facility] [address] [place], [place] [address] Phone: [phone]  Fax: [phone]     02/16/2021, 12:31 PM  This note was partially dictated with voice recognition software. Similar sounding words can be transcribed inadequately or may not  be corrected upon review.

## 2021-02-26 LAB — METANEPHRINES, URINE, 24 HOUR
Metaneph Total, Ur: 78 ug/L
Metanephrines, 24H Ur: 86 ug/24 hr (ref 36–209)
Normetanephrine, 24H Ur: 410 ug/24 hr (ref 131–612)
Normetanephrine, Ur: 373 ug/L

## 2021-02-28 LAB — CATECHOLAMINES, FRACTIONATED, URINE, 24 HOUR
Dopamine , 24H Ur: 156 ug/24 hr (ref 0–510)
Dopamine, Rand Ur: 142 ug/L
Epinephrine, 24H Ur: 3 ug/24 hr (ref 0–20)
Epinephrine, Rand Ur: 3 ug/L
Norepinephrine, 24H Ur: 43 ug/24 hr (ref 0–135)
Norepinephrine, Rand Ur: 39 ug/L

## 2021-03-01 LAB — ALDOSTERONE, URINE

## 2021-03-02 ENCOUNTER — Other Ambulatory Visit: Payer: Self-pay

## 2021-03-02 DIAGNOSIS — D3502 Benign neoplasm of left adrenal gland: Secondary | ICD-10-CM

## 2021-03-03 ENCOUNTER — Ambulatory Visit: Payer: Medicare HMO | Admitting: "Endocrinology

## 2021-03-10 ENCOUNTER — Other Ambulatory Visit: Payer: Self-pay

## 2021-03-10 ENCOUNTER — Ambulatory Visit: Payer: Medicare HMO | Admitting: "Endocrinology

## 2021-03-10 DIAGNOSIS — D3502 Benign neoplasm of left adrenal gland: Secondary | ICD-10-CM

## 2021-03-18 LAB — CREATININE, URINE, 24 HOUR
Creatinine, 24H Ur: 972 mg/24 hr (ref 800–1800)
Creatinine, Urine: 88.4 mg/dL

## 2021-03-22 ENCOUNTER — Telehealth: Payer: Self-pay | Admitting: "Endocrinology

## 2021-03-22 LAB — CORTISOL, URINE, FREE
Cortisol (Ur), Free: 8 ug/24 hr (ref 6–42)
Cortisol,F,ug/L,U: 7 ug/L

## 2021-03-22 NOTE — Telephone Encounter (Signed)
Left VM for pt to call back to reschedule appt. All of her labs are back.

## 2021-07-22 ENCOUNTER — Encounter: Payer: Self-pay | Admitting: Gastroenterology

## 2021-07-22 NOTE — Progress Notes (Unsigned)
° ° ° ° °  Primary Care Physician: Monico Blitz, MD  Primary Gastroenterologist:    No chief complaint on file.   HPI: Yesenia Bishop is a 71 y.o. female here   Acute change in medical status earlier this month with acute left posterior frontal intraparenchymal hemorrhage seen on imaging studies when she presented with neurodeficits.  Hospitalized at [facility].  Patient was referred to endocrinology to check for functionality of probable adrenal adenoma.  MRI abdomen with and without contrast August 2021 to follow-up left adrenal lesion: IMPRESSION: 1. Unchanged size of the enhancing 1.6 cm left adrenal nodule with imaging characteristics most consistent with a benign adrenal adenoma. Consider correlation with biochemical laboratory values to assess for functionality. 2. Mild diffuse hepatic steatosis. 3. Moderate size hiatal hernia.     Electronically Signed   By: Dahlia Bailiff MD   On: 01/04/2021 23:58 Colonoscopy April 2022: -Two 4 to 9 mm polyps in the ascending colon, removed with a cold snare. Resected and retrieved. Redundant colon. -The examination was otherwise -Tubular adenoma with no high-grade dysplasia or carcinoma. -Next colonoscopy in 5 years.  Current Outpatient Medications  Medication Sig Dispense Refill   amLODipine-olmesartan (AZOR) 10-40 MG tablet Take 1 tablet by mouth every evening.      atorvastatin (LIPITOR) 10 MG tablet Take 10 mg by mouth at bedtime.     Calcium Carbonate-Vitamin D (CALTRATE 600+D PO) Take 1 tablet by mouth every evening.      Carboxymethylcellul-Glycerin (LUBRICATING EYE DROPS OP) Place 1 drop into both eyes daily as needed (dry eyes).     carvedilol (COREG) 6.25 MG tablet Take 6.25 mg by mouth at bedtime.     Garlic 4765 MG CAPS Take 1,000 mg by mouth every other day.  (Patient not taking: Reported on 02/16/2021)     hydrochlorothiazide (HYDRODIURIL) 25 MG tablet Take 25 mg by mouth daily.     omeprazole (PRILOSEC) 40 MG capsule  TAKE 1 CAPSULE BY MOUTH TWICE A DAY (Patient taking differently: Take 40 mg by mouth in the morning and at bedtime.) 180 capsule 1   No current facility-administered medications for this visit.    Allergies as of 07/25/2021   (No Known Allergies)    ROS:  General: Negative for anorexia, weight loss, fever, chills, fatigue, weakness. ENT: Negative for hoarseness, difficulty swallowing , nasal congestion. CV: Negative for chest pain, angina, palpitations, dyspnea on exertion, peripheral edema.  Respiratory: Negative for dyspnea at rest, dyspnea on exertion, cough, sputum, wheezing.  GI: See history of present illness. GU:  Negative for dysuria, hematuria, urinary incontinence, urinary frequency, nocturnal urination.  Endo: Negative for unusual weight change.    Physical Examination:   There were no vitals taken for this visit.  General: Well-nourished, well-developed in no acute distress.  Eyes: No icterus. Mouth: Oropharyngeal mucosa moist and pink , no lesions erythema or exudate. Lungs: Clear to auscultation bilaterally.  Heart: Regular rate and rhythm, no murmurs rubs or gallops.  Abdomen: Bowel sounds are normal, nontender, nondistended, no hepatosplenomegaly or masses, no abdominal bruits or hernia , no rebound or guarding.   Extremities: No lower extremity edema. No clubbing or deformities. Neuro: Alert and oriented x 4   Skin: Warm and dry, no jaundice.   Psych: Alert and cooperative, normal mood and affect.  Labs:  ***  Imaging Studies: No results found.   Assessment:     Plan:

## 2021-07-25 ENCOUNTER — Ambulatory Visit: Payer: Medicare HMO | Admitting: Gastroenterology

## 2021-07-25 ENCOUNTER — Encounter: Payer: Self-pay | Admitting: Gastroenterology

## 2022-08-11 ENCOUNTER — Encounter: Payer: Self-pay | Admitting: Internal Medicine

## 2022-08-21 ENCOUNTER — Other Ambulatory Visit: Payer: Self-pay | Admitting: Internal Medicine

## 2022-08-21 DIAGNOSIS — Z1231 Encounter for screening mammogram for malignant neoplasm of breast: Secondary | ICD-10-CM

## 2022-08-22 ENCOUNTER — Ambulatory Visit
Admission: RE | Admit: 2022-08-22 | Discharge: 2022-08-22 | Disposition: A | Discharge status: Home / Self Care | Payer: Medicare HMO | Source: Ambulatory Visit | Attending: Internal Medicine | Admitting: Internal Medicine

## 2022-08-22 DIAGNOSIS — Z1231 Encounter for screening mammogram for malignant neoplasm of breast: Secondary | ICD-10-CM

## 2023-07-16 ENCOUNTER — Other Ambulatory Visit: Payer: Self-pay | Admitting: Internal Medicine

## 2023-07-16 DIAGNOSIS — Z1231 Encounter for screening mammogram for malignant neoplasm of breast: Secondary | ICD-10-CM

## 2023-08-24 ENCOUNTER — Ambulatory Visit
Admission: RE | Admit: 2023-08-24 | Discharge: 2023-08-24 | Disposition: A | Discharge status: Home / Self Care | Payer: Medicare HMO | Source: Ambulatory Visit | Attending: Internal Medicine | Admitting: Internal Medicine

## 2023-08-24 DIAGNOSIS — Z1231 Encounter for screening mammogram for malignant neoplasm of breast: Secondary | ICD-10-CM

## 2023-08-29 ENCOUNTER — Other Ambulatory Visit: Payer: Self-pay | Admitting: Internal Medicine

## 2023-08-29 DIAGNOSIS — R928 Other abnormal and inconclusive findings on diagnostic imaging of breast: Secondary | ICD-10-CM

## 2023-10-02 ENCOUNTER — Other Ambulatory Visit

## 2023-10-02 ENCOUNTER — Encounter

## 2023-10-31 ENCOUNTER — Ambulatory Visit
Admission: RE | Admit: 2023-10-31 | Discharge: 2023-10-31 | Disposition: A | Discharge status: Home / Self Care | Source: Ambulatory Visit | Attending: Internal Medicine | Admitting: Internal Medicine

## 2023-10-31 DIAGNOSIS — R928 Other abnormal and inconclusive findings on diagnostic imaging of breast: Secondary | ICD-10-CM

## 2024-02-08 ENCOUNTER — Encounter: Payer: Self-pay | Admitting: Internal Medicine

## 2024-02-22 ENCOUNTER — Encounter: Payer: Self-pay | Admitting: Internal Medicine

## 2024-03-31 ENCOUNTER — Ambulatory Visit: Admitting: Internal Medicine

## 2024-04-01 ENCOUNTER — Ambulatory Visit: Admitting: Internal Medicine

## 2024-04-01 ENCOUNTER — Telehealth: Payer: Self-pay | Admitting: *Deleted

## 2024-04-01 ENCOUNTER — Encounter: Payer: Self-pay | Admitting: Internal Medicine

## 2024-04-01 ENCOUNTER — Other Ambulatory Visit: Payer: Self-pay | Admitting: *Deleted

## 2024-04-01 VITALS — BP 115/76 | HR 71 | Temp 98.7°F | Ht 66.0 in | Wt 139.2 lb

## 2024-04-01 DIAGNOSIS — K219 Gastro-esophageal reflux disease without esophagitis: Secondary | ICD-10-CM

## 2024-04-01 DIAGNOSIS — R634 Abnormal weight loss: Secondary | ICD-10-CM

## 2024-04-01 DIAGNOSIS — R109 Unspecified abdominal pain: Secondary | ICD-10-CM

## 2024-04-01 NOTE — Telephone Encounter (Signed)
 PA approved via [employer] Auth# [id number] DOS: 04/01/2024-09/28/2024   CTA scheduled for 11/5, arrival 3:00pm. Called pt son (on dpr) and he is aware of appt details. He voiced understanding.

## 2024-04-01 NOTE — Patient Instructions (Signed)
 It was good to see you today  Weight loss is concerning.  There is vague abdominal pain which could be due to any number of possibilities.  To begin the evaluation, I recommend we proceed with a CT angiogram abdomen and pelvic x-ray to look at the intra-abdominal organs and assess for blood flow to those organs.  We will get that study as soon as possible and make further recommendations once those results are available for review.

## 2024-04-01 NOTE — Progress Notes (Unsigned)
 Gastroenterology Progress Note    Primary Care Physician:  Maree Isles, MD Primary Gastroenterologist:  Dr. Shaaron  Pre-Procedure History & Physical: HPI:  Yesenia Bishop is a 73 y.o. female here for further evaluation of weight loss and vague abdominal pain.  This 73 year old lady has a history of CVA and has significant expressive and receptive dysphasia.  In our office today she weighs 139 pounds in our office in 2021 she weighed 201 pounds. She answers my question with yes she perseverates.  Her son trace who accompanies her today notes vague complaint of abdominal pain sometimes after she eats sometimes not.  She has become a picky eater after her stroke.  She does not seem to have esophageal dysphagia although she did have dysphagia back in 2021 and had her normal-appearing esophagus empirically dilated.  2022, she had colonic adenomas removed and it was recommended she return in 2027 for surveillance examination.  She has a history of chronic constipation on Linzess.  Son says she no longer takes it as she has no difficulties having a bowel movement and he endorses a regular bowel movement every day.  Chronic GERD well-controlled on Protonix  40 mg daily.  History of IVC filter placed.  She is not anticoagulated.  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 Redell File: Excellent..  Unable to navigate proximal to the hepatic flexure.  Otherwise visualized colon was normal.  Consider  propofol , exam following up with barium enema.   COLONOSCOPY WITH PROPOFOL  N/A 09/20/2020   2 tubular adenomas removed.  Next colonoscopy in 5 years.  Procedure: COLONOSCOPY WITH PROPOFOL ;  Surgeon: Shaaron Lamar HERO, MD;  [facility];  Service: Endoscopy;  Laterality: N/A;  AM   ESOPHAGOGASTRODUODENOSCOPY N/A 08/13/2019   Shyla Gayheart: Esophagus normal, status post dilation for history of dysphagia.  Large hiatal hernia.  Gastric polyps removed, hyperplastic.   LEFT HEART CATH AND CORONARY ANGIOGRAPHY N/A 08/28/2018   Procedure: LEFT HEART CATH AND CORONARY ANGIOGRAPHY;  Surgeon: Ladona Heinz, MD;  [facility];  Service: Cardiovascular;  Laterality: N/A;   MALONEY DILATION N/A 08/13/2019   Procedure: AGAPITO DILATION;  Surgeon: Shaaron Lamar HERO, MD;  [facility];  Service: Endoscopy;  Laterality: N/A;   POLYPECTOMY  09/20/2020   Procedure: POLYPECTOMY;  Surgeon: Shaaron Lamar HERO, MD;  [facility];  Service: Endoscopy;;   SHOULDER SURGERY Right    TUBAL LIGATION      Prior to Admission medications   Medication Sig Start Date End Date Taking? Authorizing Provider  amLODipine -olmesartan  (AZOR ) 10-40 MG tablet Take 1 tablet by mouth every evening.    Yes [provider]  atorvastatin  (LIPITOR) 10 MG tablet Take 10 mg by mouth at bedtime. 07/21/20  Yes [provider]  Calcium  Carbonate-Vitamin D (CALTRATE 600+D PO) Take 1 tablet by mouth every evening.    Yes [provider]  carvedilol  (COREG ) 6.25  MG tablet Take 6.25 mg by mouth at bedtime. 04/12/20  Yes [provider]  divalproex (DEPAKOTE ER) 500 MG 24 hr tablet Take 500 mg by mouth daily.   Yes [provider]  furosemide  (LASIX ) 20 MG tablet Take 20 mg by mouth daily.   Yes [provider]  hydrochlorothiazide (HYDRODIURIL) 25 MG tablet Take 25 mg by mouth daily. 05/05/20  Yes [provider]  Lacosamide 150 MG TABS Take 1 tablet by mouth  2 (two) times daily. 03/05/24  Yes [provider]  mupirocin ointment (BACTROBAN) 2 % Apply 1 Application topically 3 (three) times daily. 03/16/24  Yes [provider]  pantoprazole  (PROTONIX ) 40 MG tablet Take 40 mg by mouth daily. 02/12/24  Yes [provider]  potassium chloride  (KLOR-CON ) 10 MEQ tablet Take 20 mEq by mouth daily. 02/22/24  Yes [provider]    Allergies as of 04/01/2024   (No Known Allergies)    Family History  Problem Relation Age of Onset   Diabetes Father    Heart disease Father    Hyperlipidemia Father    Hypertension Father    Varicose Veins Father    Hypertension Brother    Breast cancer Neg Hx     Social History   Socioeconomic History   Marital status: Widowed    Spouse name: Not on file   Number of children: Not on file   Years of education: Not on file   Highest education level: Not on file  Occupational History   Not on file  Tobacco Use   Smoking status: Never   Smokeless tobacco: Never  Vaping Use   Vaping status: Never Used  Substance and Sexual Activity   Alcohol  use: Not Currently    Alcohol /week: 1.0 standard drink of alcohol     Types: 1 Glasses of wine per week    Comment: socially   Drug use: No   Sexual activity: Not on file  Other Topics Concern   Not on file  Social History Narrative   Not on file   Social Drivers of Health   Financial Resource Strain: Low Risk  (11/07/2021)   [facility]   Overall Financial Resource Strain (CARDIA)    Difficulty of Paying Living Expenses: Not hard at all  Food Insecurity: No Food Insecurity (11/07/2021)   Received from [facility]   Hunger Vital Sign    Within the past 12 months, you worried that your food would run out before you got the money to buy more.: Never true    Within the past 12 months, the food you bought just didn't last and you didn't have money to get more.: Never true  Transportation Needs: No Transportation  Needs (11/07/2021)   Received from [facility]   PRAPARE - Transportation    Lack of Transportation (Medical): No    Lack of Transportation (Non-Medical): No  Physical Activity: Not on file  Stress: Not on file  Social Connections: Not on file  Intimate Partner Violence: Not on file    Review of Systems   See HPI, otherwise negative ROS  Physical Exam: BP 115/76 (BP Location: Right Arm, Patient Position: Sitting, Cuff Size: Normal)   Pulse 71   Temp 98.7 F (37.1 C) (Oral)   Ht 5' 6 (1.676 m)   Wt 139 lb 3.2 oz (63.1 kg)   BMI 22.47 kg/m  General:   Alert,  Well-developed, well-nourished, pleasant and cooperative in NAD Neck:  Supple; no masses or thyromegaly. No significant cervical adenopathy. Lungs:  Clear throughout to auscultation.   No wheezes, crackles, or rhonchi. No acute distress. Heart:  Regular rate and rhythm; no murmurs, clicks, rubs,  or gallops. Abdomen: Non-distended, normal bowel sounds.  Soft and nontender without appreciable mass or hepatosplenomegaly.  Rectal: No external lesions.  Digital exam reveals no impaction or mass.  Scant brown stool Hemoccult negative Impression/Plan:   73 year old lady with significant neurologic defect from CVA with a history of hyperlipidemia, myocardial infarction presents with significant weight loss reported over the past year.  We certainly have her down by 60 pounds or so since she was seen in 2021.  She has vague abdominal pain me history is a bit unreliable here.  Son does note that she is peculiar as far as food she likes to eat and often does not eat that much.  Having said that, she has multiple risk factors for mesenteric arterial stenosis.  That needs to be looked into first and foremost.  To that end, I recommended patient undergo a CTA of the abdomen and pelvis to look at mesenteric vasculature and other intra-abdominal structures.  Once that study has been performed we will review it and make further  recommendations in the very near future.  For now, continue Protonix  40 mg daily    Notice: This dictation was prepared with Dragon dictation along with smaller phrase technology. Any transcriptional errors that result from this process are unintentional and may not be corrected upon review.

## 2024-04-02 ENCOUNTER — Ambulatory Visit (HOSPITAL_COMMUNITY)
Admission: RE | Admit: 2024-04-02 | Discharge: 2024-04-02 | Disposition: A | Discharge status: Home / Self Care | Source: Ambulatory Visit | Attending: Internal Medicine | Admitting: Internal Medicine

## 2024-04-02 ENCOUNTER — Encounter (HOSPITAL_COMMUNITY): Payer: Self-pay

## 2024-04-02 DIAGNOSIS — R109 Unspecified abdominal pain: Secondary | ICD-10-CM | POA: Diagnosis present

## 2024-04-02 DIAGNOSIS — R634 Abnormal weight loss: Secondary | ICD-10-CM | POA: Insufficient documentation

## 2024-04-02 MED ORDER — IOHEXOL 350 MG/ML SOLN
100.0000 mL | Freq: Once | INTRAVENOUS | Status: AC | PRN
  Administered 2024-04-02: 100 mL via INTRAVENOUS

## 2024-04-05 ENCOUNTER — Ambulatory Visit: Payer: Self-pay | Admitting: Internal Medicine

## 2024-06-04 ENCOUNTER — Encounter: Payer: Self-pay | Admitting: Gastroenterology

## 2024-06-04 ENCOUNTER — Ambulatory Visit: Admitting: Gastroenterology

## 2024-06-04 VITALS — BP 116/77 | HR 85 | Temp 97.8°F | Ht 66.0 in | Wt 131.4 lb

## 2024-06-04 DIAGNOSIS — R634 Abnormal weight loss: Secondary | ICD-10-CM | POA: Diagnosis not present

## 2024-06-04 DIAGNOSIS — R1030 Lower abdominal pain, unspecified: Secondary | ICD-10-CM | POA: Diagnosis not present

## 2024-06-04 DIAGNOSIS — E559 Vitamin D deficiency, unspecified: Secondary | ICD-10-CM | POA: Diagnosis not present

## 2024-06-04 DIAGNOSIS — R300 Dysuria: Secondary | ICD-10-CM | POA: Insufficient documentation

## 2024-06-04 MED ORDER — VITAMIN D 50 MCG (2000 UT) PO TABS: 2000.0000 [IU] | ORAL_TABLET | Freq: Every day | ORAL | Status: AC

## 2024-06-04 NOTE — Progress Notes (Signed)
 "    GI Office Note    Referring Provider: Maree Isles, MD Primary Care Physician:  Maree Isles, MD  Primary Gastroenterologist: Ozell Hollingshead, MD   Chief Complaint   Chief Complaint  Patient presents with   Follow-up    Still having lower abd pain    History of Present Illness   Yesenia Bishop is a 74 y.o. female presenting today for follow up. Last seen in 03/2024. History of vague abdominal pain and weight loss. 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   She answers ok and shakes head yes/no. Son, Nalani helps with communication.   History of esophageal dysphagia in 2021, EGD with normal appearing esophagus empirically dilated, large hiatal hernia, gastric polyp (hyperplastic) removed, h.pylori. In 2022, she had colonic adenomas removed and advised surveillance exam in 2027. She has h/o chronic constipation previously on Linzess. Chronic GERD controlled on pantoprazole  40mg  daily.   She completed CTA since her last visit. See below. Her pantoprazole  was switched to omeprazole  10mg  twice daily by her PCP.  Discussed the use of AI scribe software for clinical note transcription with the patient, who gave verbal consent to proceed.  History of Present Illness   Complex abdominal surgical history includes remote bowel surgery for intestines not configured right, cholecystectomy, appendectomy, and tubal ligation. She also has large hiatal hernia previously seen on EGD. She has multiple small abdominal wall hernias containing fat. IVC filter removed in 10/2023.   For the past year she has intermittent lower, occasionally mid-abdominal pain occurring a few times per week, lasting a few minutes per episode. It has no clear relation to meals, bowel movements, or position and does not change with eating  or defecation. She has no heartburn, dysphagia, odynophagia, hematochezia, or melena. At times episodes coincide with dysuria, and she has had prior urinary tract infections. She complains of current dysuria.   She has marked unintentional weight loss from 205 lb in September 2022 to 139 lb in November 2025 and 131 lb today. She believes her weight loss started about 1 year ago. She avoids bread but has started consuming additional foods that she was not at her last visit. She typically eats twice daily. Notes she used to eat once per day. Her son thought she has picked up some weight since last visit but she is down about 8 pounds. Son notes her appetite changes significantly after her stroke.   Bowel movements are daily or every other day with soft, normal appearing stool and no blood or black stool. She was changed from pantoprazole  to omeprazole  10 mg twice daily last month by her PCP. SABRA  She has low vitamin D  on 03/2024, denies supplementation for this. She is chronically on Calcium  with vit D.   Wt Readings from Last 3 Encounters:  06/04/24 131 lb 6.4 oz (59.6 kg)  04/01/24 139 lb 3.2 oz (63.1 kg)  02/16/21 205 lb (93 kg)     Prior Data     Results     Labs from April 14, 2024: White blood cell count 5.3, hemoglobin 12.4, platelets 167, glucose 94, creatinine 1.28, sodium 138, potassium 4.4, albumin 4.5, total bilirubin 0.4, alk phos 53, AST 20, ALT 8, TSH 1.67, vitamin D  18, LDL 146.  CTA A/P 03/31/2024: IMPRESSION: 1. No acute findings in the abdomen/pelvis. 2. Mild atherosclerotic disease  of the abdominal aorta without aneurysm or dissection. 3. 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 4. Moderate size hiatal hernia unchanged. 5. Several small midline ventral hernias containing only peritoneal fat unchanged. 6. Aortic atherosclerosis.    Medications   Current Outpatient Medications  Medication Sig Dispense  Refill   amLODipine -olmesartan  (AZOR ) 10-40 MG tablet Take 1 tablet by mouth every evening.      atorvastatin  (LIPITOR) 10 MG tablet Take 10 mg by mouth at bedtime.     Calcium  Carbonate-Vitamin D  (CALTRATE 600+D PO) Take 1 tablet by mouth every evening.      carvedilol  (COREG ) 6.25 MG tablet Take 6.25 mg by mouth at bedtime.     divalproex (DEPAKOTE ER) 500 MG 24 hr tablet Take 500 mg by mouth daily.     famotidine (PEPCID) 40 MG tablet Take 40 mg by mouth daily.     furosemide  (LASIX ) 20 MG tablet Take 20 mg by mouth daily.     hydrochlorothiazide (HYDRODIURIL) 25 MG tablet Take 25 mg by mouth daily.     Lacosamide 150 MG TABS Take 1 tablet by mouth 2 (two) times daily.     losartan (COZAAR) 100 MG tablet Take 100 mg by mouth daily.     mupirocin ointment (BACTROBAN) 2 % Apply 1 Application topically 3 (three) times daily.     omeprazole  (PRILOSEC) 10 MG capsule Take 10 mg by mouth 2 (two) times daily.     potassium chloride  (KLOR-CON ) 10 MEQ tablet Take 20 mEq by mouth daily.     No current facility-administered medications for this visit.    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 Redell File: Excellent..  Unable to navigate proximal to the hepatic flexure.  Otherwise visualized colon was normal.  Consider propofol , exam following up with barium enema.   COLONOSCOPY WITH PROPOFOL  N/A 09/20/2020   2  tubular adenomas removed.  Next colonoscopy in 5 years.  Procedure: COLONOSCOPY WITH PROPOFOL ;  Surgeon: Shaaron Lamar HERO, MD;  [facility];  Service: Endoscopy;  Laterality: N/A;  AM   ESOPHAGOGASTRODUODENOSCOPY N/A 08/13/2019   Rourk: Esophagus normal, status post dilation for history of dysphagia.  Large hiatal hernia.  Gastric polyps removed, hyperplastic.   LEFT HEART CATH AND CORONARY ANGIOGRAPHY N/A 08/28/2018   Procedure: LEFT HEART CATH AND CORONARY ANGIOGRAPHY;  Surgeon: Ladona Heinz, MD;  [facility];  Service: Cardiovascular;  Laterality: N/A;   MALONEY DILATION N/A 08/13/2019   Procedure: AGAPITO DILATION;  Surgeon: Shaaron Lamar HERO, MD;  [facility];  Service: Endoscopy;  Laterality: N/A;   POLYPECTOMY  09/20/2020   Procedure: POLYPECTOMY;  Surgeon: Shaaron Lamar HERO, MD;  [facility];  Service: Endoscopy;;   SHOULDER SURGERY Right  TUBAL LIGATION      Past Family History   Family History  Problem Relation Age of Onset   Diabetes Father    Heart disease Father    Hyperlipidemia Father    Hypertension Father    Varicose Veins Father    Hypertension Brother    Breast cancer Neg Hx     Past Social History   Social History   Socioeconomic History   Marital status: Widowed    Spouse name: Not on file   Number of children: Not on file   Years of education: Not on file   Highest education level: Not on file  Occupational History   Not on file  Tobacco Use   Smoking status: Never   Smokeless tobacco: Never  Vaping Use   Vaping status: Never Used  Substance and Sexual Activity   Alcohol  use: Not Currently    Alcohol /week: 1.0 standard drink of alcohol     Types: 1 Glasses of wine per week    Comment: socially   Drug use: No   Sexual activity: Not on file  Other Topics Concern   Not on file  Social History Narrative   Not on file   Social Drivers of Health   Tobacco Use: Low Risk (06/04/2024)   Patient History     Smoking Tobacco Use: Never    Smokeless Tobacco Use: Never    Passive Exposure: Not on file  Financial Resource Strain: Low Risk (11/07/2021)   Received from [facility]   Overall Financial Resource Strain (CARDIA)    Difficulty of Paying Living Expenses: Not hard at all  Food Insecurity: No Food Insecurity (11/07/2021)   Received from [facility]   Epic    Within the past 12 months, you worried that your food would run out before you got the money to buy more.: Never true    Within the past 12 months, the food you bought just didn't last and you didn't have money to get more.: Never true  Transportation Needs: No Transportation Needs (11/07/2021)   Received from [facility]   PRAPARE - Transportation    Lack of Transportation (Medical): No    Lack of Transportation (Non-Medical): No  Physical Activity: Not on file  Stress: Not on file  Social Connections: Not on file  Intimate Partner Violence: Not on file  Depression (EYV7-0): Not on file  Alcohol  Screen: Not on file  Housing: Not on file  Utilities: Not on file  Health Literacy: Not on file    Review of Systems   General: Negative for anorexia, weight loss, fever, chills, fatigue, weakness. ENT: Negative for hoarseness, difficulty swallowing , nasal congestion. CV: Negative for chest pain, angina, palpitations, dyspnea on exertion, peripheral edema.  Respiratory: Negative for dyspnea at rest, dyspnea on exertion, cough, sputum, wheezing.  GI: See history of present illness. GU:  Negative for dysuria, hematuria, urinary incontinence, urinary frequency, nocturnal urination.  Endo: Negative for unusual weight change.     Physical Exam   BP 116/77   Pulse 85   Temp 97.8 F (36.6 C) (Oral)   Ht 5' 6 (1.676 m)   Wt 131 lb 6.4 oz (59.6 kg)   SpO2 99%   BMI 21.21 kg/m    General: Well-nourished, well-developed in no acute distress.  Eyes: No icterus. Mouth: Oropharyngeal mucosa moist and pink   Lungs:  Clear to auscultation bilaterally.  Heart: Regular rate and rhythm, no murmurs rubs or gallops.  Abdomen: Bowel  sounds are normal, nondistended, no hepatosplenomegaly or masses,  no rebound or guarding. Tender in low mid abdomen and slight left of midline Rectal: not performed Extremities: No lower extremity edema. No clubbing or deformities. Neuro: Alert and oriented x 4   Skin: Warm and dry, no jaundice.   Psych: Alert and cooperative, normal mood and affect.  Labs   See above Imaging Studies   No results found.  Assessment/Plan:    Assessment & Plan Intermittent lower abdominal pain and significant weight loss at least over the past one year but noted changes (per son) since her stroke in 2023.  -CTA reassuring, she has several stable ventral hernias, these would not explain her weight loss.  -her weight loss likely is multifactorial, cannot exclude neurological component but need to consider gi as well.  -she denies heartburn, constipation, postprandial abdominal pain. She repeatedly reports dysuria, although infectious etiology not likely going to explain chronic abdominal pain and weight loss.  -will order U/A with culture reflex -increase omeprazole  to 20mg  BID before meals -discuss findings with Dr. Shaaron. She may require endoscopic evaluation to rule out other underlying GI issues.  -encouraged adding nutritional supplement such as Boost or Ensure. Try to eat 3 meals a day.     Vitamin D  deficiency Confirmed vitamin D  deficiency. Current supplementation insufficient for correction. Patient and son deny recent treatment of vit D deficiency. - Recommended high-dose weekly vitamin D  supplementation for 8 weeks. - repeat labs in 6-8 weeks. Will update other labs at that time as well.              Sonny RAMAN. Ezzard, MHS, PA-C [facility]  "

## 2024-06-04 NOTE — Patient Instructions (Addendum)
 Increase omeprazole  to 20mg  twice a day before breakfast and evening meal. Check your medication at home, if you do not have enough medication to increase to two twice a day, let me know and I will send in new RX with new strength.  Complete urine test.  Add vitamin D  2000 international unit daily. You can buy this over the counter. You should have follow up lab in six weeks to make sure levels have improved. We will send you reminder.   Try to eat three meals a day. Consider adding a protein shake such as Ensure or Boost.   We will be in touch with further recommendations after I update Dr. Shaaron on your symptoms.

## 2024-06-07 ENCOUNTER — Ambulatory Visit: Payer: Self-pay | Admitting: Gastroenterology

## 2024-06-09 LAB — UA/M W/RFLX CULTURE, ROUTINE
Bilirubin, UA: NEGATIVE
Glucose, UA: NEGATIVE
Ketones, UA: NEGATIVE
Nitrite, UA: NEGATIVE
Protein,UA: NEGATIVE
RBC, UA: NEGATIVE
Specific Gravity, UA: 1.017 (ref 1.005–1.030)
Urobilinogen, Ur: 1 mg/dL (ref 0.2–1.0)
pH, UA: 7 (ref 5.0–7.5)

## 2024-06-09 LAB — MICROSCOPIC EXAMINATION
Bacteria, UA: NONE SEEN
Casts: NONE SEEN /LPF
RBC, Urine: NONE SEEN /HPF (ref 0–2)

## 2024-06-09 LAB — URINE CULTURE, REFLEX

## 2024-06-10 MED ORDER — CIPROFLOXACIN HCL 500 MG PO TABS: 500.0000 mg | ORAL_TABLET | Freq: Two times a day (BID) | ORAL | 0 refills | Status: AC

## 2024-07-02 ENCOUNTER — Other Ambulatory Visit: Payer: Self-pay

## 2024-07-02 DIAGNOSIS — R1030 Lower abdominal pain, unspecified: Secondary | ICD-10-CM

## 2024-07-02 DIAGNOSIS — E559 Vitamin D deficiency, unspecified: Secondary | ICD-10-CM

## 2024-07-02 DIAGNOSIS — R300 Dysuria: Secondary | ICD-10-CM

## 2024-07-02 DIAGNOSIS — R634 Abnormal weight loss: Secondary | ICD-10-CM
